# Patient Record
Sex: Male | Born: 1935 | Race: White | Hispanic: No | Smoking: Former smoker
Health system: Southern US, Community
[De-identification: ages and names within clinical notes are randomized; demographics above are authoritative.]

## PROBLEM LIST (undated history)

## (undated) DIAGNOSIS — K08109 Complete loss of teeth, unspecified cause, unspecified class: Secondary | ICD-10-CM

## (undated) DIAGNOSIS — I499 Cardiac arrhythmia, unspecified: Secondary | ICD-10-CM

## (undated) DIAGNOSIS — Z972 Presence of dental prosthetic device (complete) (partial): Secondary | ICD-10-CM

## (undated) DIAGNOSIS — G5790 Unspecified mononeuropathy of unspecified lower limb: Secondary | ICD-10-CM

## (undated) DIAGNOSIS — Z973 Presence of spectacles and contact lenses: Secondary | ICD-10-CM

## (undated) DIAGNOSIS — H9190 Unspecified hearing loss, unspecified ear: Secondary | ICD-10-CM

## (undated) DIAGNOSIS — K219 Gastro-esophageal reflux disease without esophagitis: Secondary | ICD-10-CM

## (undated) DIAGNOSIS — I10 Essential (primary) hypertension: Secondary | ICD-10-CM

## (undated) DIAGNOSIS — M1990 Unspecified osteoarthritis, unspecified site: Secondary | ICD-10-CM

## (undated) DIAGNOSIS — N400 Enlarged prostate without lower urinary tract symptoms: Secondary | ICD-10-CM

## (undated) HISTORY — PX: TONSILLECTOMY: SUR1361

## (undated) HISTORY — PX: COLONOSCOPY: SHX174

## (undated) HISTORY — PX: EYE SURGERY: SHX253

---

## 1980-02-07 HISTORY — PX: CERVICAL LAMINECTOMY: SHX94

## 1988-02-07 HISTORY — PX: PLANTAR FASCIA SURGERY: SHX746

## 1993-02-06 HISTORY — PX: HERNIA REPAIR: SHX51

## 1998-02-06 HISTORY — PX: ABDOMINAL HERNIA REPAIR: SHX539

## 2005-06-21 ENCOUNTER — Ambulatory Visit: Payer: Self-pay | Admitting: Unknown Physician Specialty

## 2005-08-23 ENCOUNTER — Ambulatory Visit: Payer: Self-pay | Admitting: Unknown Physician Specialty

## 2008-12-17 ENCOUNTER — Ambulatory Visit: Payer: Self-pay | Admitting: Unknown Physician Specialty

## 2009-01-15 ENCOUNTER — Ambulatory Visit: Payer: Self-pay | Admitting: Surgery

## 2009-01-22 ENCOUNTER — Ambulatory Visit: Payer: Self-pay | Admitting: Surgery

## 2009-02-06 HISTORY — PX: THUMB ARTHROSCOPY: SHX2509

## 2009-05-13 ENCOUNTER — Ambulatory Visit: Payer: Self-pay | Admitting: Internal Medicine

## 2012-09-02 ENCOUNTER — Ambulatory Visit: Payer: Self-pay | Admitting: Orthopedic Surgery

## 2012-12-13 ENCOUNTER — Other Ambulatory Visit: Payer: Self-pay | Admitting: Orthopedic Surgery

## 2012-12-27 ENCOUNTER — Encounter (HOSPITAL_BASED_OUTPATIENT_CLINIC_OR_DEPARTMENT_OTHER): Payer: Self-pay | Admitting: *Deleted

## 2012-12-27 NOTE — Progress Notes (Signed)
Will come in for bmet-[id number]-separated with wife but she is coming with him-to bring all meds dos Saw a cardiologist for irreg hr 2 yr ago-will get notes

## 2012-12-30 ENCOUNTER — Other Ambulatory Visit: Payer: Self-pay

## 2012-12-30 ENCOUNTER — Encounter (HOSPITAL_BASED_OUTPATIENT_CLINIC_OR_DEPARTMENT_OTHER)
Admission: RE | Admit: 2012-12-30 | Discharge: 2012-12-30 | Disposition: A | Discharge status: Home / Self Care | Payer: Medicare Other | Source: Ambulatory Visit | Attending: Orthopedic Surgery | Admitting: Orthopedic Surgery

## 2012-12-30 DIAGNOSIS — Z01818 Encounter for other preprocedural examination: Secondary | ICD-10-CM | POA: Insufficient documentation

## 2012-12-30 DIAGNOSIS — Z01812 Encounter for preprocedural laboratory examination: Secondary | ICD-10-CM | POA: Insufficient documentation

## 2012-12-30 DIAGNOSIS — Z01810 Encounter for preprocedural cardiovascular examination: Secondary | ICD-10-CM | POA: Insufficient documentation

## 2012-12-30 LAB — BASIC METABOLIC PANEL
Calcium: 9.6 mg/dL (ref 8.4–10.5)
GFR calc Af Amer: 83 mL/min — ABNORMAL LOW (ref >90)
GFR calc non Af Amer: 71 mL/min — ABNORMAL LOW (ref >90)
Potassium: 4.8 mEq/L (ref 3.5–5.1)
Sodium: 138 mEq/L (ref 135–145)

## 2013-01-01 NOTE — H&P (Signed)
Jimmy Hines is an 77 y.o. male.   CC / Reason for Visit: Right shoulder pain HPI: This patient is a 77 year old male who presents for evaluation of right shoulder pain, having undergone MRI scan at [facility] on 09-02-12.  While the images aren't available for my review, the most pertinent findings on the report are a.c. joint DJD and distal supraspinatus tendinosis versus incomplete tear.  The patient reports that his symptoms remain in fact awaken him at 320 this morning as she rolled onto his right side and had shoulder pain.   In the past, I have operated on both TMC joints with an Artelon interposition arthroplasty, left side on 01-14-08, right side prior to that.  On the left side is bothered by continued Z-shaped collapse.  However the right shoulder remains increasingly symptomatic.  Records indicate that I injected the subacromial space on 08-02-11, and he reports that he has had a couple of injections since then, the most recent 6-8 weeks ago by a rheumatologist.  He has had x-rays and then let to believe that he has spurs that jeopardize his rotator cuff.  Past Medical History  Diagnosis Date  . Wears glasses     reading  . Hypertension   . GERD (gastroesophageal reflux disease)   . Arthritis   . BPH (benign prostatic hypertrophy)   . Dysrhythmia     ireg hr  . Full dentures     Past Surgical History  Procedure Laterality Date  . Tonsillectomy    . Plantar fascia surgery  1990    right  . Thumb arthroscopy  2011    right and left  . Hernia repair  1995    rt ing   . Abdominal hernia repair  2000  . Cervical laminectomy  1982  . Colonoscopy      History reviewed. No pertinent family history. Social History:  reports that he quit smoking about 30 years ago. He does not have any smokeless tobacco history on file. He reports that he drinks alcohol. He reports that he does not use illicit drugs.  Allergies: No Known Allergies  No prescriptions prior  to admission    No results found for this or any previous visit (from the past 48 hour(s)). No results found.  Review of Systems  All other systems reviewed and are negative.    Height 5\' 11"  (1.803 m), weight 95.255 kg (210 lb). Physical Exam  Constitutional:  WD, WN, NAD HEENT:  NCAT, EOMI Neuro/Psych:  Alert & oriented to person, place, and time; appropriate mood & affect Lymphatic: No generalized UE edema or lymphadenopathy Extremities / MSK:  Both UE are normal with respect to appearance, ranges of motion, joint stability, muscle strength/tone, sensation, & perfusion except as otherwise noted:  Both hands exhibit small joint osteoarthritis with a Z-shaped collapse on the left side more so than the right side.  The [facility] joint is enlarged.  Right shoulder has full range of motion equal to the contralateral side.  He has some subacromial tenderness and positive impingement maneuvers.  He has increased pain with empty can testing, but no a.c. joint tenderness or pain with cross arm test.  Labs / Xrays:  No radiographic studies obtained today.  Assessment:  Right shoulder pain, with subacromial bursitis and possible rotator cuff pathology  Plan: We discussed the MRI findings, at least as represented in the report.  He would like to proceed with arthroscopic evaluation and debridement versus repair, possibly with a  DCE as well.    Atara Paterson A. 01/01/2013, 3:24 PM

## 2013-01-06 ENCOUNTER — Encounter (HOSPITAL_BASED_OUTPATIENT_CLINIC_OR_DEPARTMENT_OTHER): Payer: Self-pay | Admitting: Certified Registered Nurse Anesthetist

## 2013-01-06 ENCOUNTER — Encounter (HOSPITAL_BASED_OUTPATIENT_CLINIC_OR_DEPARTMENT_OTHER): Payer: Medicare Other | Admitting: Certified Registered Nurse Anesthetist

## 2013-01-06 ENCOUNTER — Ambulatory Visit (HOSPITAL_BASED_OUTPATIENT_CLINIC_OR_DEPARTMENT_OTHER): Payer: Medicare Other | Admitting: Certified Registered Nurse Anesthetist

## 2013-01-06 ENCOUNTER — Ambulatory Visit (HOSPITAL_BASED_OUTPATIENT_CLINIC_OR_DEPARTMENT_OTHER)
Admission: RE | Admit: 2013-01-06 | Discharge: 2013-01-06 | Disposition: A | Discharge status: Home / Self Care | Payer: Medicare Other | Source: Ambulatory Visit | Attending: Orthopedic Surgery | Admitting: Orthopedic Surgery

## 2013-01-06 ENCOUNTER — Encounter (HOSPITAL_BASED_OUTPATIENT_CLINIC_OR_DEPARTMENT_OTHER)
Admission: RE | Disposition: A | Discharge status: Home / Self Care | Payer: Self-pay | Source: Ambulatory Visit | Attending: Orthopedic Surgery

## 2013-01-06 DIAGNOSIS — M75110 Incomplete rotator cuff tear or rupture of unspecified shoulder, not specified as traumatic: Secondary | ICD-10-CM | POA: Insufficient documentation

## 2013-01-06 DIAGNOSIS — M9420 Chondromalacia, unspecified site: Secondary | ICD-10-CM | POA: Insufficient documentation

## 2013-01-06 DIAGNOSIS — I10 Essential (primary) hypertension: Secondary | ICD-10-CM | POA: Insufficient documentation

## 2013-01-06 DIAGNOSIS — Z87891 Personal history of nicotine dependence: Secondary | ICD-10-CM | POA: Insufficient documentation

## 2013-01-06 DIAGNOSIS — M6789 Other specified disorders of synovium and tendon, multiple sites: Secondary | ICD-10-CM | POA: Insufficient documentation

## 2013-01-06 DIAGNOSIS — M719 Bursopathy, unspecified: Secondary | ICD-10-CM | POA: Insufficient documentation

## 2013-01-06 DIAGNOSIS — M67919 Unspecified disorder of synovium and tendon, unspecified shoulder: Secondary | ICD-10-CM | POA: Insufficient documentation

## 2013-01-06 HISTORY — DX: Presence of spectacles and contact lenses: Z97.3

## 2013-01-06 HISTORY — DX: Essential (primary) hypertension: I10

## 2013-01-06 HISTORY — DX: Cardiac arrhythmia, unspecified: I49.9

## 2013-01-06 HISTORY — DX: Benign prostatic hyperplasia without lower urinary tract symptoms: N40.0

## 2013-01-06 HISTORY — DX: Gastro-esophageal reflux disease without esophagitis: K21.9

## 2013-01-06 HISTORY — DX: Presence of dental prosthetic device (complete) (partial): Z97.2

## 2013-01-06 HISTORY — DX: Unspecified osteoarthritis, unspecified site: M19.90

## 2013-01-06 HISTORY — PX: SHOULDER ARTHROSCOPY WITH ROTATOR CUFF REPAIR: SHX5685

## 2013-01-06 HISTORY — DX: Complete loss of teeth, unspecified cause, unspecified class: K08.109

## 2013-01-06 LAB — POCT HEMOGLOBIN-HEMACUE: Hemoglobin: 15.9 g/dL (ref 13.0–17.0)

## 2013-01-06 SURGERY — ARTHROSCOPY, SHOULDER, WITH ROTATOR CUFF REPAIR
Anesthesia: General | Site: Shoulder | Laterality: Right | Wound class: Clean

## 2013-01-06 MED ORDER — MIDAZOLAM HCL 2 MG/2ML IJ SOLN
1.0000 mg | INTRAMUSCULAR | Status: DC | PRN
  Administered 2013-01-06: 2 mg via INTRAVENOUS

## 2013-01-06 MED ORDER — OXYCODONE HCL 5 MG/5ML PO SOLN: 5.0000 mg | Freq: Once | ORAL | Status: DC | PRN

## 2013-01-06 MED ORDER — HYDROMORPHONE HCL PF 1 MG/ML IJ SOLN
0.2500 mg | INTRAMUSCULAR | Status: DC | PRN
  Administered 2013-01-06: 0.25 mg via INTRAVENOUS

## 2013-01-06 MED ORDER — FENTANYL CITRATE 0.05 MG/ML IJ SOLN
INTRAMUSCULAR | Status: AC
  Filled 2013-01-06: qty 6

## 2013-01-06 MED ORDER — LACTATED RINGERS IV SOLN
INTRAVENOUS | Status: DC
  Administered 2013-01-06 (×2): via INTRAVENOUS

## 2013-01-06 MED ORDER — OXYCODONE HCL 5 MG PO TABS: 5.0000 mg | ORAL_TABLET | Freq: Once | ORAL | Status: DC | PRN

## 2013-01-06 MED ORDER — BUPIVACAINE-EPINEPHRINE 0.25% -1:200000 IJ SOLN
INTRAMUSCULAR | Status: DC | PRN
  Administered 2013-01-06: 20 mL

## 2013-01-06 MED ORDER — PROPOFOL 10 MG/ML IV BOLUS
INTRAVENOUS | Status: DC | PRN
  Administered 2013-01-06: 200 mg via INTRAVENOUS

## 2013-01-06 MED ORDER — EPHEDRINE SULFATE 50 MG/ML IJ SOLN
INTRAMUSCULAR | Status: DC | PRN
  Administered 2013-01-06: 10 mg via INTRAVENOUS

## 2013-01-06 MED ORDER — DEXAMETHASONE SODIUM PHOSPHATE 4 MG/ML IJ SOLN
INTRAMUSCULAR | Status: DC | PRN
  Administered 2013-01-06: 4 mg via INTRAVENOUS

## 2013-01-06 MED ORDER — EPINEPHRINE HCL 1 MG/ML IJ SOLN
INTRAMUSCULAR | Status: DC | PRN
  Administered 2013-01-06: 1 mg

## 2013-01-06 MED ORDER — CHLORHEXIDINE GLUCONATE 4 % EX LIQD: 60.0000 mL | Freq: Once | CUTANEOUS | Status: DC

## 2013-01-06 MED ORDER — BUPIVACAINE-EPINEPHRINE PF 0.5-1:200000 % IJ SOLN
INTRAMUSCULAR | Status: AC
  Filled 2013-01-06: qty 30

## 2013-01-06 MED ORDER — FENTANYL CITRATE 0.05 MG/ML IJ SOLN
50.0000 ug | INTRAMUSCULAR | Status: DC | PRN
  Administered 2013-01-06: 100 ug via INTRAVENOUS

## 2013-01-06 MED ORDER — HYDROMORPHONE HCL PF 1 MG/ML IJ SOLN
INTRAMUSCULAR | Status: AC
  Filled 2013-01-06: qty 1

## 2013-01-06 MED ORDER — MIDAZOLAM HCL 2 MG/2ML IJ SOLN
INTRAMUSCULAR | Status: AC
  Filled 2013-01-06: qty 2

## 2013-01-06 MED ORDER — ONDANSETRON HCL 4 MG/2ML IJ SOLN: 4.0000 mg | Freq: Once | INTRAMUSCULAR | Status: DC | PRN

## 2013-01-06 MED ORDER — LIDOCAINE HCL (CARDIAC) 20 MG/ML IV SOLN
INTRAVENOUS | Status: DC | PRN
  Administered 2013-01-06: 30 mg via INTRAVENOUS

## 2013-01-06 MED ORDER — PROPOFOL 10 MG/ML IV EMUL
INTRAVENOUS | Status: AC
  Filled 2013-01-06: qty 50

## 2013-01-06 MED ORDER — OXYCODONE-ACETAMINOPHEN 5-325 MG PO TABS: 1.0000 | ORAL_TABLET | ORAL | Status: DC | PRN

## 2013-01-06 MED ORDER — LACTATED RINGERS IR SOLN
Status: DC | PRN
  Administered 2013-01-06: 15000 mL

## 2013-01-06 MED ORDER — LACTATED RINGERS IV SOLN: INTRAVENOUS | Status: DC

## 2013-01-06 MED ORDER — FENTANYL CITRATE 0.05 MG/ML IJ SOLN
INTRAMUSCULAR | Status: AC
  Filled 2013-01-06: qty 2

## 2013-01-06 MED ORDER — ONDANSETRON HCL 4 MG/2ML IJ SOLN
INTRAMUSCULAR | Status: DC | PRN
  Administered 2013-01-06: 4 mg via INTRAVENOUS

## 2013-01-06 MED ORDER — PHENYLEPHRINE HCL 10 MG/ML IJ SOLN
10.0000 mg | INTRAVENOUS | Status: DC | PRN
  Administered 2013-01-06: 40 ug/min via INTRAVENOUS

## 2013-01-06 MED ORDER — HYDROMORPHONE HCL PF 1 MG/ML IJ SOLN: 0.5000 mg | INTRAMUSCULAR | Status: DC | PRN

## 2013-01-06 MED ORDER — CEFAZOLIN SODIUM-DEXTROSE 2-3 GM-% IV SOLR
2.0000 g | INTRAVENOUS | Status: AC
  Administered 2013-01-06: 2 g via INTRAVENOUS

## 2013-01-06 MED ORDER — SUCCINYLCHOLINE CHLORIDE 20 MG/ML IJ SOLN
INTRAMUSCULAR | Status: DC | PRN
  Administered 2013-01-06: 50 mg via INTRAVENOUS

## 2013-01-06 MED ORDER — GLYCOPYRROLATE 0.2 MG/ML IJ SOLN
INTRAMUSCULAR | Status: DC | PRN
  Administered 2013-01-06: 0.2 mg via INTRAVENOUS

## 2013-01-06 MED ORDER — BUPIVACAINE-EPINEPHRINE PF 0.25-1:200000 % IJ SOLN
INTRAMUSCULAR | Status: AC
  Filled 2013-01-06: qty 30

## 2013-01-06 MED ORDER — EPINEPHRINE HCL 1 MG/ML IJ SOLN
INTRAMUSCULAR | Status: AC
  Filled 2013-01-06: qty 1

## 2013-01-06 MED ORDER — FENTANYL CITRATE 0.05 MG/ML IJ SOLN
INTRAMUSCULAR | Status: DC | PRN
  Administered 2013-01-06: 50 ug via INTRAVENOUS

## 2013-01-06 SURGICAL SUPPLY — 85 items
ANCHOR BIO SWLOCK 4.75 W/TIG (Anchor) ×2 IMPLANT
ANCHOR SUT BIO SW 4.75X19.1 (Anchor) ×2 IMPLANT
BENZOIN TINCTURE PRP APPL 2/3 (GAUZE/BANDAGES/DRESSINGS) IMPLANT
BLADE AVERAGE 25X9 (BLADE) IMPLANT
BLADE CUDA 5.5 (BLADE) IMPLANT
BLADE CUDA SHAVER 3.5 (BLADE) IMPLANT
BLADE CUTTER GATOR 3.5 (BLADE) ×2 IMPLANT
BLADE GREAT WHITE 4.2 (BLADE) IMPLANT
BLADE SURG 15 STRL LF DISP TIS (BLADE) IMPLANT
BLADE SURG 15 STRL SS (BLADE)
BUR OVAL 4.0 (BURR) IMPLANT
BUR OVAL 6.0 (BURR) ×2 IMPLANT
BUR VERTEX HOODED 4.5 (BURR) IMPLANT
CANISTER SUCT 3000ML (MISCELLANEOUS) IMPLANT
CANISTER SUCT LVC 12 LTR MEDI- (MISCELLANEOUS) ×2 IMPLANT
CANNULA 5.75X71 LONG (CANNULA) IMPLANT
CANNULA PASSPORT 3 (MISCELLANEOUS) ×2 IMPLANT
CANNULA SHOULDER 7CM (CANNULA) IMPLANT
CANNULA TWIST IN 8.25X7CM (CANNULA) ×2 IMPLANT
CHLORAPREP W/TINT 26ML (MISCELLANEOUS) ×4 IMPLANT
CLEANER CAUTERY TIP 5X5 PAD (MISCELLANEOUS) IMPLANT
COVER MAYO STAND STRL (DRAPES) ×2 IMPLANT
DECANTER SPIKE VIAL GLASS SM (MISCELLANEOUS) IMPLANT
DERMABOND ADVANCED (GAUZE/BANDAGES/DRESSINGS)
DERMABOND ADVANCED .7 DNX12 (GAUZE/BANDAGES/DRESSINGS) IMPLANT
DRAPE ARTHROSCOPY W/POUCH 90 (DRAPES) IMPLANT
DRAPE STERI 35X30 U-POUCH (DRAPES) ×2 IMPLANT
DRAPE SURG 17X23 STRL (DRAPES) ×4 IMPLANT
DRAPE U-SHAPE 47X51 STRL (DRAPES) ×2 IMPLANT
DRAPE U-SHAPE 76X120 STRL (DRAPES) ×4 IMPLANT
DRSG EMULSION OIL 3X3 NADH (GAUZE/BANDAGES/DRESSINGS) ×2 IMPLANT
DRSG TEGADERM 4X4.75 (GAUZE/BANDAGES/DRESSINGS) IMPLANT
ELECT MENISCUS 165MM 90D (ELECTRODE) IMPLANT
ELECT REM PT RETURN 9FT ADLT (ELECTROSURGICAL) ×2
ELECTRODE REM PT RTRN 9FT ADLT (ELECTROSURGICAL) ×1 IMPLANT
GLOVE BIO SURGEON STRL SZ7.5 (GLOVE) ×2 IMPLANT
GLOVE BIOGEL PI IND STRL 7.0 (GLOVE) ×2 IMPLANT
GLOVE BIOGEL PI IND STRL 8 (GLOVE) ×1 IMPLANT
GLOVE BIOGEL PI INDICATOR 7.0 (GLOVE) ×2
GLOVE BIOGEL PI INDICATOR 8 (GLOVE) ×1
GLOVE ECLIPSE 6.5 STRL STRAW (GLOVE) ×2 IMPLANT
GOWN PREVENTION PLUS XLARGE (GOWN DISPOSABLE) ×4 IMPLANT
IV LACTATED RINGER IRRG 3000ML (IV SOLUTION) ×4
IV LR IRRIG 3000ML ARTHROMATIC (IV SOLUTION) ×4 IMPLANT
NDL SUT 6 .5 CRC .975X.05 MAYO (NEEDLE) IMPLANT
NEEDLE MAYO TAPER (NEEDLE)
NEEDLE SCORPION MULTI FIRE (NEEDLE) ×2 IMPLANT
PACK ARTHROSCOPY DSU (CUSTOM PROCEDURE TRAY) ×2 IMPLANT
PACK BASIN DAY SURGERY FS (CUSTOM PROCEDURE TRAY) ×2 IMPLANT
PAD CLEANER CAUTERY TIP 5X5 (MISCELLANEOUS)
PASSER SUT SWANSON 36MM LOOP (INSTRUMENTS) IMPLANT
PENCIL BUTTON HOLSTER BLD 10FT (ELECTRODE) IMPLANT
SET ARTHROSCOPY TUBING (MISCELLANEOUS) ×1
SET ARTHROSCOPY TUBING LN (MISCELLANEOUS) ×1 IMPLANT
SHEET MEDIUM DRAPE 40X70 STRL (DRAPES) ×2 IMPLANT
SLING ARM FOAM STRAP LRG (SOFTGOODS) ×2 IMPLANT
SLING ARM FOAM STRAP MED (SOFTGOODS) IMPLANT
SLING ARM FOAM STRAP SML (SOFTGOODS) IMPLANT
SLING ARM FOAM STRAP XLG (SOFTGOODS) IMPLANT
SLING ULTRA II LARGE (SOFTGOODS) IMPLANT
SLING ULTRA III MED (ORTHOPEDIC SUPPLIES) IMPLANT
SPONGE GAUZE 4X4 12PLY (GAUZE/BANDAGES/DRESSINGS) ×2 IMPLANT
SPONGE LAP 4X18 X RAY DECT (DISPOSABLE) IMPLANT
STAPLER VISISTAT 35W (STAPLE) ×2 IMPLANT
STRIP CLOSURE SKIN 1/2X4 (GAUZE/BANDAGES/DRESSINGS) IMPLANT
SUCTION FRAZIER TIP 10 FR DISP (SUCTIONS) IMPLANT
SUT ETHIBOND 2 OS 4 DA (SUTURE) IMPLANT
SUT ETHILON 3 0 PS 1 (SUTURE) IMPLANT
SUT FIBERWIRE #2 38 T-5 BLUE (SUTURE)
SUT TIGER TAPE 7 IN WHITE (SUTURE) IMPLANT
SUT VIC AB 0 SH 27 (SUTURE) IMPLANT
SUT VIC AB 2-0 CT3 27 (SUTURE) IMPLANT
SUT VIC AB 2-0 SH 27 (SUTURE)
SUT VIC AB 2-0 SH 27XBRD (SUTURE) IMPLANT
SUT VIC AB 3-0 SH 27 (SUTURE) ×1
SUT VIC AB 3-0 SH 27X BRD (SUTURE) ×1 IMPLANT
SUT VICRYL 4-0 PS2 18IN ABS (SUTURE) IMPLANT
SUT VICRYL RAPIDE 4/0 PS 2 (SUTURE) IMPLANT
SUTURE FIBERWR #2 38 T-5 BLUE (SUTURE) IMPLANT
SYR BULB 3OZ (MISCELLANEOUS) IMPLANT
TAPE FIBER 2MM 7IN #2 BLUE (SUTURE) IMPLANT
TOWEL OR 17X24 6PK STRL BLUE (TOWEL DISPOSABLE) ×2 IMPLANT
TOWEL OR NON WOVEN STRL DISP B (DISPOSABLE) IMPLANT
WAND STAR VAC 90 (SURGICAL WAND) ×2 IMPLANT
YANKAUER SUCT BULB TIP NO VENT (SUCTIONS) ×2 IMPLANT

## 2013-01-06 NOTE — Anesthesia Postprocedure Evaluation (Signed)
  Anesthesia Post-op Note  Patient: Jimmy Hines  Procedure(s) Performed: Procedure(s): RIGHT SHOULDER ARTHROSCOPY WITH ROTATOR CUFF REPAIR POSSIBLE DISTAL CLAVICAL EXCISION (Right)  Patient Location: PACU  Anesthesia Type:GA combined with regional for post-op pain  Level of Consciousness: awake, alert  and oriented  Airway and Oxygen Therapy: Patient Spontanous Breathing and Patient connected to face mask oxygen  Post-op Pain: mild  Post-op Assessment: Post-op Vital signs reviewed  Post-op Vital Signs: Reviewed  Complications: No apparent anesthesia complications

## 2013-01-06 NOTE — Interval H&P Note (Signed)
History and Physical Interval Note:  01/06/2013 7:34 AM  Jimmy Hines  has presented today for surgery, with the diagnosis of right shoulder partial rotator cuff tear  The various methods of treatment have been discussed with the patient and family. After consideration of risks, benefits and other options for treatment, the patient has consented to  Procedure(s): RIGHT SHOULDER ARTHROSCOPY WITH POSSIBLEROTATOR CUFF REPAIR POSSIBLE DISTAL CLAVICAL EXCISION (Right) as a surgical intervention .  The patient's history has been reviewed, patient examined, no change in status, stable for surgery.  I have reviewed the patient's chart and labs.  Questions were answered to the patient's satisfaction.     Tamar Lipscomb A.

## 2013-01-06 NOTE — Anesthesia Preprocedure Evaluation (Addendum)
Anesthesia Evaluation  Patient identified by MRN, date of birth, ID band Patient awake    Reviewed: Allergy & Precautions, NPO status   Airway Mallampati: I TM Distance: >3 FB Neck ROM: Full    Dental  (+) Teeth Intact and Dental Advisory Given   Pulmonary former smoker,  breath sounds clear to auscultation        Cardiovascular hypertension, Pt. on medications and Pt. on home beta blockers + dysrhythmias Rhythm:Regular Rate:Normal     Neuro/Psych    GI/Hepatic GERD-  Medicated and Controlled,  Endo/Other    Renal/GU      Musculoskeletal   Abdominal   Peds  Hematology   Anesthesia Other Findings   Reproductive/Obstetrics                          Anesthesia Physical Anesthesia Plan  ASA: II  Anesthesia Plan: General   Post-op Pain Management:    Induction: Intravenous  Airway Management Planned: Oral ETT  Additional Equipment:   Intra-op Plan:   Post-operative Plan: Extubation in OR  Informed Consent: I have reviewed the patients History and Physical, chart, labs and discussed the procedure including the risks, benefits and alternatives for the proposed anesthesia with the patient or authorized representative who has indicated his/her understanding and acceptance.   Dental advisory given  Plan Discussed with: CRNA, Anesthesiologist and Surgeon  Anesthesia Plan Comments:         Anesthesia Quick Evaluation

## 2013-01-06 NOTE — Progress Notes (Signed)
Assisted Dr. Crews with right, ultrasound guided, interscalene  block. Side rails up, monitors on throughout procedure. See vital signs in flow sheet. Tolerated Procedure well. 

## 2013-01-06 NOTE — Op Note (Signed)
01/06/2013  7:35 AM  PATIENT:  Jimmy Hines  77 y.o. male  PRE-OPERATIVE DIAGNOSIS:  Right shoulder rotator cuff tear, subacromial bursitis, a.c. joint arthritis  POST-OPERATIVE DIAGNOSIS:  Same, plus bicipital tendinosis  PROCEDURE:  Right shoulder arthroscopy with intra-articular debridement, biceps tenotomy, rotator cuff repair, subacromial decompression with acromioplasty and distal clavicle excision  SURGEON: Cliffton Asters. Janee Morn, MD  PHYSICIAN ASSISTANT: None  ANESTHESIA:  regional and general  SPECIMENS:  None  DRAINS:   None  PREOPERATIVE INDICATIONS:  Jimmy Hines is a  77 y.o. male with a diagnosis of right shoulder pain with MRI suggestive of rotator cuff tear who failed conservative measures and elected for surgical management.    The risks benefits and alternatives were discussed with the patient preoperatively including but not limited to the risks of infection, bleeding, nerve injury, cardiopulmonary complications, the need for revision surgery, among others, and the patient verbalized understanding and consented to proceed.  OPERATIVE IMPLANTS: 4.75 mm Arthrex swivel lock anchors x2 with double row repair  OPERATIVE PROCEDURE:  After receiving prophylactic antibiotics and interscalene block, the patient was escorted to the operative theatre and placed in a supine position.  General anesthesia was administered.  He was positioned in a beachchair position with care to ensure the neck was not hyperextended and the appropriate pressure points padded. The right upper extremity was then prepped with DuraPrep and draped in usual sterile fashion. A surgical "time-out" was performed during which the planned procedure, proposed operative site, and the correct patient identity were compared to the operative consent and agreement confirmed by the circulating nurse according to current facility policy.     Extra landmarks were drawn and the possible portal sites anesthetized with  quarter percent Marcaine with epinephrine. Posterior viewing portal was established first and intra-articular arthroscopy commenced. Anterior oral was established after needle localization in the area between the subscap and biceps tendon. A probe was inserted as was the suction shaver through the anterior portal. There was about 50% detachment of the biceps at its labral attachment and split tearing up and down the length of the intra-articular portion of the tendon. In addition there was some fraying of the upper border of the subscap.  A basket punch was used to divide the tendon of the biceps is far away from the labrum as could be reasonably done. The intra-articular portion was then excised with the suction shaver and the labrum debrided much around the entire surface. The chondral surfaces of the glenoid had grade 1-2 chondromalacia which also existed on the humerus. The rotator cuff was intact except for a small U-shaped tear of the supraspinatus. The scope was then placed into the subacromial space and a anterolateral portal established. The ArthriCare wand was used to expose the anterolateral aspect of the acromion as well as helping to clear some of the subacromial bursa. Suction shaver was used as well. The tear was identified and a second more posterior portal was established, a middle lateral portal. Arthrex passport was established. The tear was identified, the edges debrided, and shaver used to lightly touched the footprint preparing it. A 4.75 mm Arthrex swivel lock anchor was placed on the articular margin and S2 tails brought through 2 portions of the U-shaped tear. A single lateral swivel lock anchor was placed securing the 2 tails, before securing it was noted that this may create a central dogear and so a single FiberWire suture was placed to the middle of that and brought through the anchor itself  thus helping to better secured without dogear. The tails were all clipped. The cuff repair was  good. Attention was directed to performing the subacromial decompression and acromioplasty with the bur and then attention directed to the distal clavicle excision. Visualization of the distal clavicle was intermittently OK secondary to bleeding and ultimately a direct superior portal was established to help ensure that the posterior portion of the clavicle was adequately debrided. Instruments were removed. The portals were all closed with 3-0 Vicryl interrupted suture followed by staples at the skin and dressing applied. He was placed into a sling, awakened, and taken to recovery in stable condition breathing spontaneously.  DISPOSITION: The patient will be discharged home today with typical instructions returning in 10-15 days for reevaluation and to initiate formal therapy for rotator cuff rehabilitation at that time.

## 2013-01-06 NOTE — Anesthesia Procedure Notes (Addendum)
Anesthesia Regional Block:  Interscalene brachial plexus block  Pre-Anesthetic Checklist: ,, timeout performed, Correct Patient, Correct Site, Correct Laterality, Correct Procedure, Correct Position, site marked, Risks and benefits discussed,  Surgical consent,  Pre-op evaluation,  At surgeon's request and post-op pain management  Laterality: Right and Upper  Prep: chloraprep       Needles:  Injection technique: Single-shot  Needle Type: Echogenic Needle     Needle Length: 5cm 5 cm Needle Gauge: 21 and 21 G    Additional Needles:  Procedures: ultrasound guided (picture in chart) Interscalene brachial plexus block Narrative:  Start time: 01/06/2013 6:55 AM End time: 01/06/2013 7:20 AM Injection made incrementally with aspirations every 5 mL.  Performed by: Personally  Anesthesiologist: Sheldon Silvan, MD   Procedure Name: Intubation Date/Time: 01/06/2013 7:45 AM Performed by: Janee Morn, DAVID A Pre-anesthesia Checklist: Patient identified, Emergency Drugs available, Suction available and Patient being monitored Patient Re-evaluated:Patient Re-evaluated prior to inductionOxygen Delivery Method: Circle System Utilized Preoxygenation: Pre-oxygenation with 100% oxygen Intubation Type: IV induction Ventilation: Mask ventilation without difficulty Laryngoscope Size: Mac and 3 Grade View: Grade I Tube type: Oral Tube size: 7.0 mm Number of attempts: 1 Airway Equipment and Method: stylet,  oral airway and LTA kit utilized Placement Confirmation: ETT inserted through vocal cords under direct vision,  positive ETCO2 and breath sounds checked- equal and bilateral Secured at: 21 cm Tube secured with: Tape Dental Injury: Teeth and Oropharynx as per pre-operative assessment

## 2013-01-06 NOTE — Transfer of Care (Signed)
Immediate Anesthesia Transfer of Care Note  Patient: Jimmy Hines  Procedure(s) Performed: Procedure(s): RIGHT SHOULDER ARTHROSCOPY WITH ROTATOR CUFF REPAIR POSSIBLE DISTAL CLAVICAL EXCISION (Right)  Patient Location: PACU  Anesthesia Type:GA combined with regional for post-op pain  Level of Consciousness: awake and patient cooperative  Airway & Oxygen Therapy: Patient Spontanous Breathing and Patient connected to face mask oxygen  Post-op Assessment: Report given to PACU RN and Post -op Vital signs reviewed and stable  Post vital signs: Reviewed and stable  Complications: No apparent anesthesia complications

## 2013-01-07 ENCOUNTER — Encounter (HOSPITAL_BASED_OUTPATIENT_CLINIC_OR_DEPARTMENT_OTHER): Payer: Self-pay | Admitting: Orthopedic Surgery

## 2014-01-22 ENCOUNTER — Ambulatory Visit: Payer: Self-pay | Admitting: Unknown Physician Specialty

## 2014-05-08 ENCOUNTER — Ambulatory Visit
Admit: 2014-05-08 | Disposition: A | Discharge status: Home / Self Care | Payer: Self-pay | Attending: Physical Medicine and Rehabilitation | Admitting: Physical Medicine and Rehabilitation

## 2014-05-15 ENCOUNTER — Ambulatory Visit
Admit: 2014-05-15 | Disposition: A | Discharge status: Home / Self Care | Payer: Self-pay | Attending: Physical Medicine and Rehabilitation | Admitting: Physical Medicine and Rehabilitation

## 2015-11-24 ENCOUNTER — Encounter: Payer: Self-pay | Admitting: Podiatry

## 2015-11-24 ENCOUNTER — Other Ambulatory Visit: Payer: Self-pay | Admitting: *Deleted

## 2015-11-24 ENCOUNTER — Ambulatory Visit (INDEPENDENT_AMBULATORY_CARE_PROVIDER_SITE_OTHER): Payer: Medicare Other | Admitting: Podiatry

## 2015-11-24 DIAGNOSIS — G629 Polyneuropathy, unspecified: Secondary | ICD-10-CM

## 2015-11-24 NOTE — Progress Notes (Signed)
   Subjective:    Patient ID: Jimmy Hines, male    DOB: Sep 23, 1935, 80 y.o.   MRN: [id number]  HPI: He presents today with plantar forefoot and bilateral toe discomfort he states that it feels cold and numb when he touches it is actually not cold. He said the sensation is been present for about several months and feels he would like a band or strap that he seems to be pulling from the arch plantarly. He states the majority of it is in the ball of the foot he has also noticed some left hip pain recently. He denies history of diabetes back trauma alcohol is. He states that he continues to take his B vitamins regularly.    Review of Systems  Musculoskeletal: Positive for arthralgias.  All other systems reviewed and are negative.      Objective:   Physical Exam: Vital signs are stable he is alert and oriented 3. Pulses are palpable. Neurologic sensorium is actually intact bilateral. Deep tendon reflexes are intact bilateral muscle strength is normal bilateral. Mild Leksell hammertoe deformities are noted bilateral. Cutaneous evaluation does not demonstrate any type of open lesions or wounds.        Assessment & Plan:  Idiopathic neuropathy.  Plan: Less than a lot of time talking with him about neuropathy today and that with his being so early and so light that he would probably not be picked up on her nerve conduction test. I recommended that he start taking B complex vitamin and make sure that he sees his primary doctor to rule out any other causes. I will follow-up with him as this progresses at which time an nerve conduction test or an epidermal nerve fiber density test will be necessary.

## 2015-12-01 ENCOUNTER — Other Ambulatory Visit: Payer: Self-pay | Admitting: *Deleted

## 2015-12-01 MED ORDER — NONFORMULARY OR COMPOUNDED ITEM: 3 refills | Status: AC

## 2016-01-26 IMAGING — MR MRA HEAD WITHOUT CONTRAST
1 series · 25 of 48 positions shown · non-contrast
Comparison: 05/08/2014 cervical spine MR.

CLINICAL DATA: 78-year-old male with cervical spine MR reporting
enlarged left vertebral artery. Subsequent encounter.

EXAM:
MRA HEAD WITHOUT CONTRAST
TECHNIQUE: Angiographic images of the Circle of Willis were obtained using MRA
technique without intravenous contrast.

[Series 2: TOF · axial · 0.8mm · 0.39mm/px · z∈[-66,+46]mm · 25 of 148 slices shown]
[im 1/148]
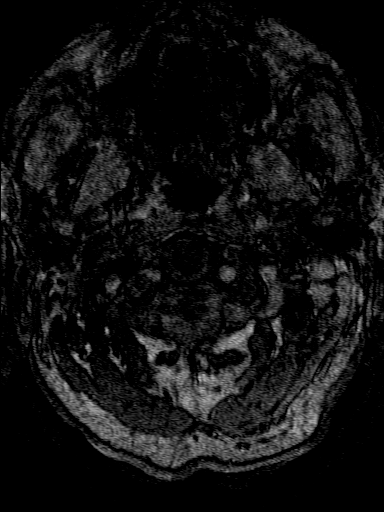
[im 4/148]
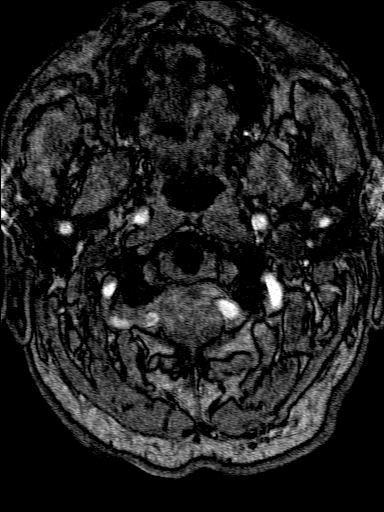
[im 7/148]
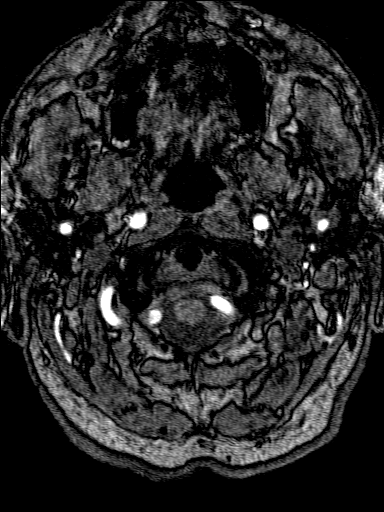
[im 10/148]
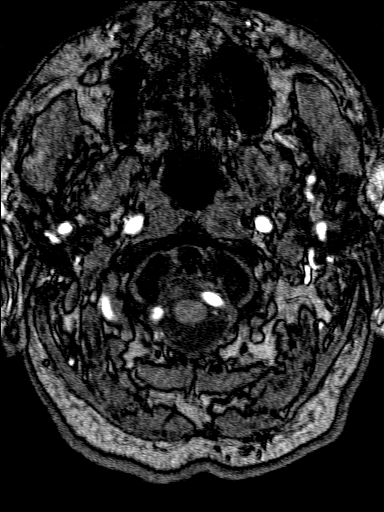
[im 13/148]
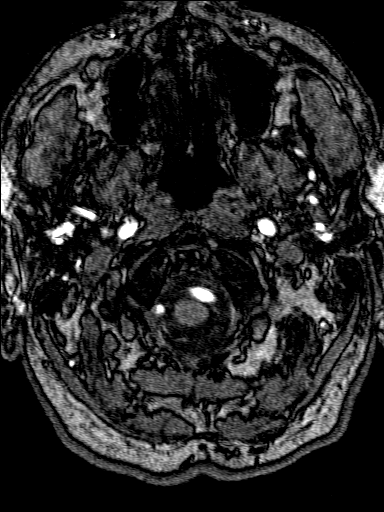
[im 16/148]
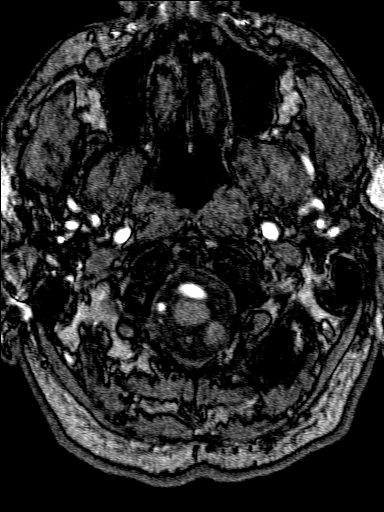
[im 19/148]
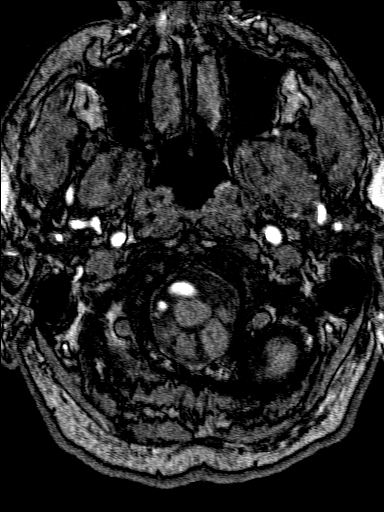
[im 22/148]
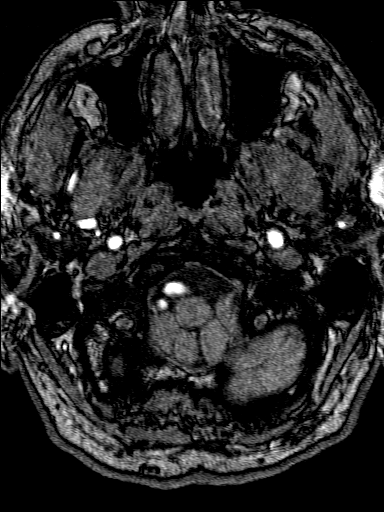
[im 26/148]
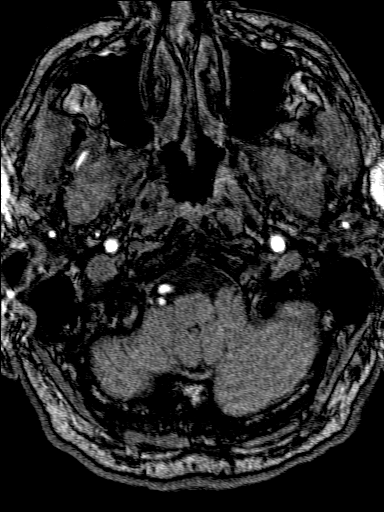
[im 29/148]
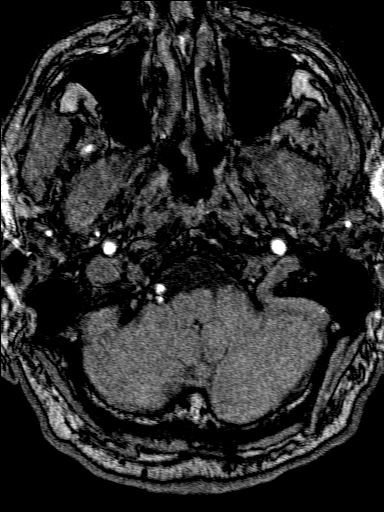
[im 32/148]
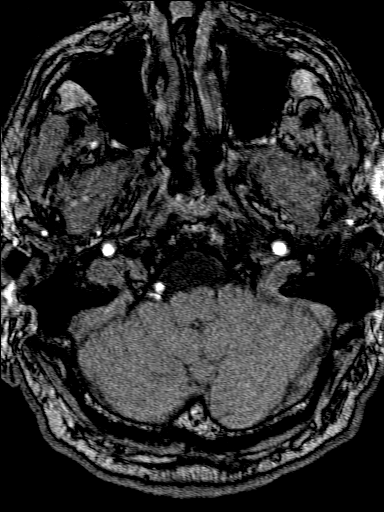
[im 35/148]
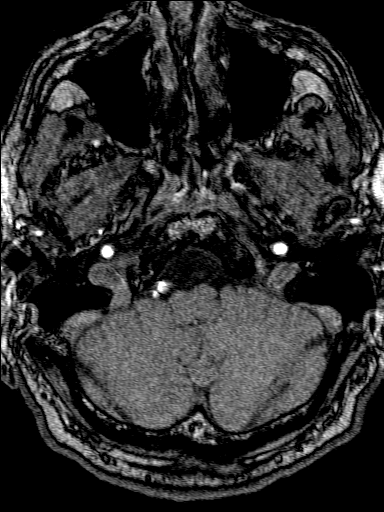
[im 38/148]
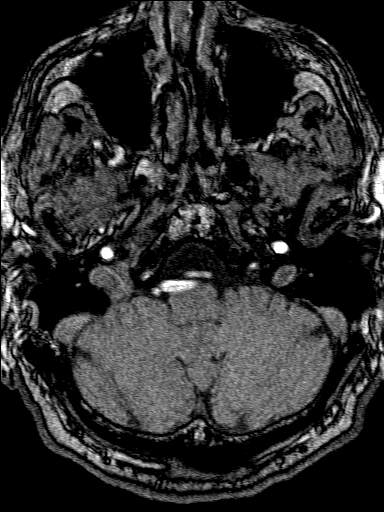
[im 41/148]
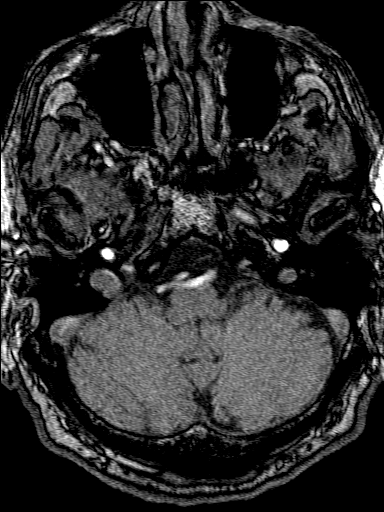
[im 44/148]
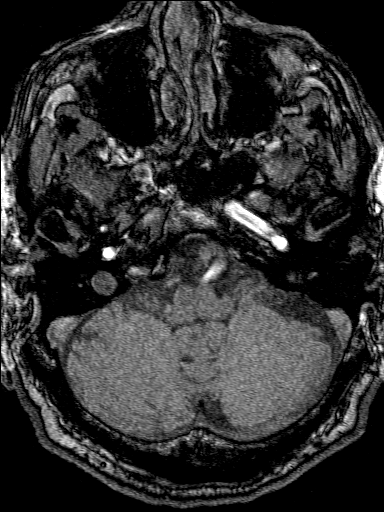
[im 47/148]
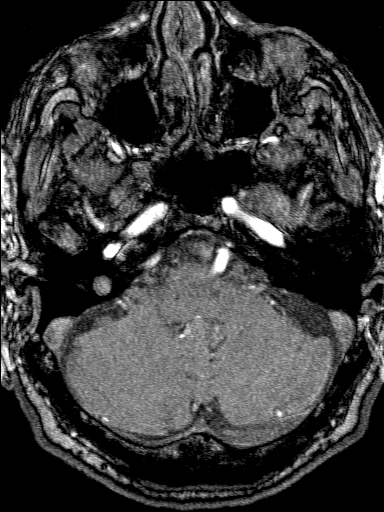
[im 51/148]
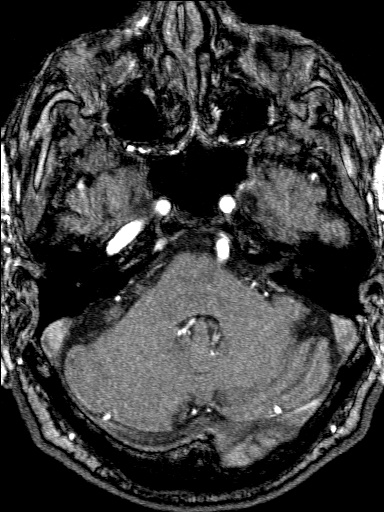
[im 54/148]
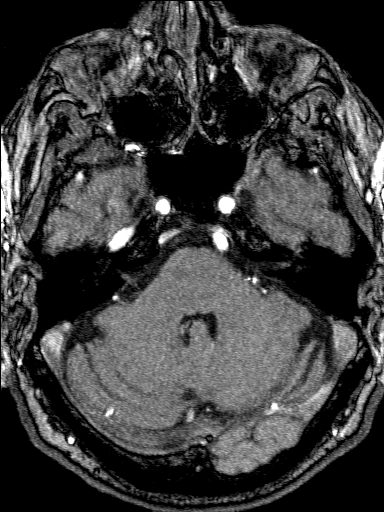
[im 66/148]
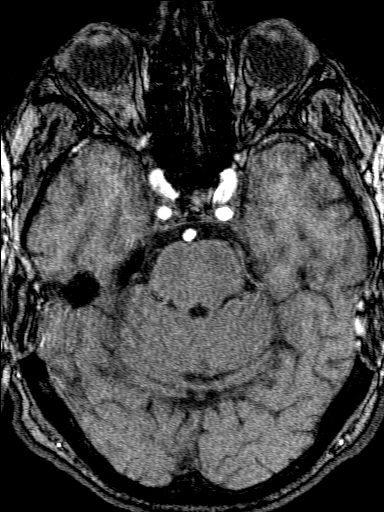
[im 76/148]
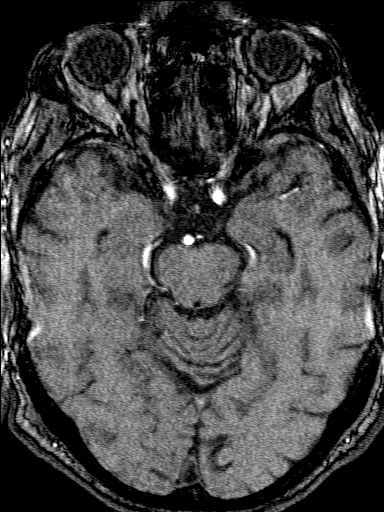
[im 85/148]
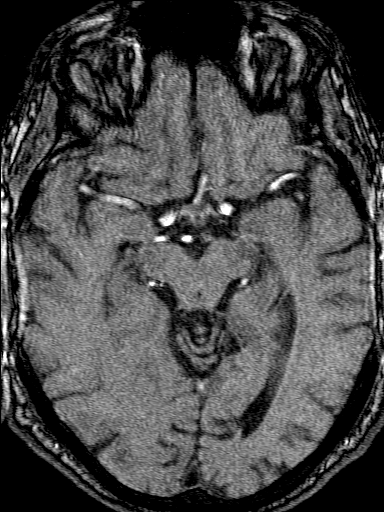
[im 104/148]
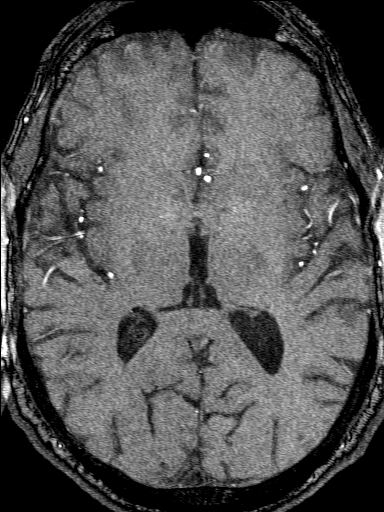
[im 122/148]
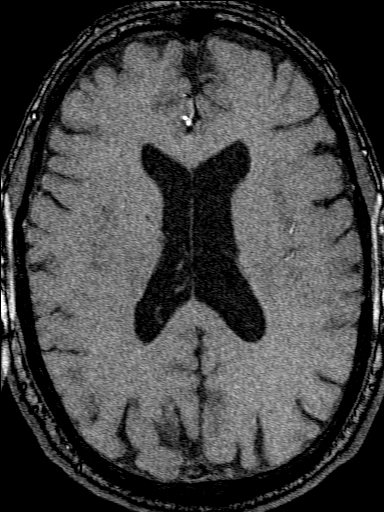
[im 126/148]
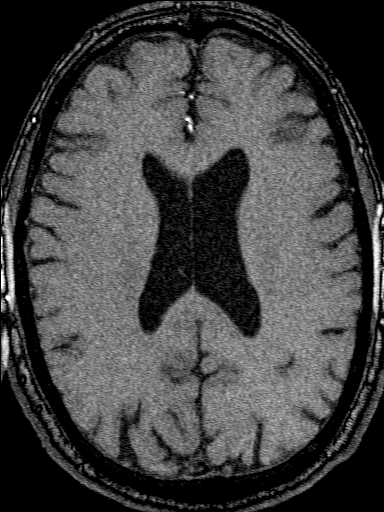
[im 141/148]
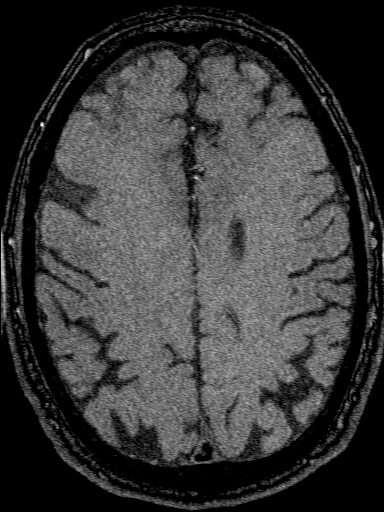

[25 of 48 positions shown; findings below may reference images not displayed]

FINDINGS: Exam is motion degraded.

Ectatic vertebral arteries more notable on the left. Right vertebral
artery with smooth narrowing after the takeoff of the right
posterior inferior cerebellar artery.

Ectatic basilar artery. Artifact versus mild narrowing proximal
basilar artery. The left posterior inferior cerebellar artery
approximates the proximal vertebral artery with fenestration a less
likely consideration.

Nonvisualized right anterior inferior cerebellar artery.

Mild narrowing and irregularity mid to distal superior cerebellar
artery bilaterally.

Mild to moderate narrowing and irregularity of the P2 -P3 segment of
the posterior cerebral arteries. Moderate narrowing and irregularity
distal posterior cerebral artery branches bilaterally.

Mild narrowing supraclinoid segment of the right internal carotid
artery.

Moderate narrowing versus artifact causing loss of signal middle
cerebral artery bifurcation bilaterally.

Mild to moderate narrowing middle cerebral artery branch vessels
bilaterally.

Artifact versus moderate narrowing proximal A2 segment anterior
cerebral arteries more notable on the right.

No aneurysm noted.

Limited imaging of brain parenchyma reveals mild global atrophy.
IMPRESSION: Intracranial atherosclerotic type changes noted as detailed above.
Motion degradation somewhat limits evaluation.

The cervical spine noted abnormality of the left vertebral artery is
felt to be related to dolichoectasia.

## 2016-10-26 ENCOUNTER — Encounter: Payer: Self-pay | Admitting: Medical Oncology

## 2016-10-26 ENCOUNTER — Emergency Department: Payer: Medicare Other

## 2016-10-26 ENCOUNTER — Emergency Department
Admission: EM | Admit: 2016-10-26 | Discharge: 2016-10-26 | Disposition: A | Discharge status: Home / Self Care | Payer: Medicare Other | Attending: Student in an Organized Health Care Education/Training Program | Admitting: Student in an Organized Health Care Education/Training Program

## 2016-10-26 DIAGNOSIS — I10 Essential (primary) hypertension: Secondary | ICD-10-CM | POA: Insufficient documentation

## 2016-10-26 DIAGNOSIS — I951 Orthostatic hypotension: Secondary | ICD-10-CM | POA: Insufficient documentation

## 2016-10-26 DIAGNOSIS — Z7982 Long term (current) use of aspirin: Secondary | ICD-10-CM | POA: Diagnosis not present

## 2016-10-26 DIAGNOSIS — R42 Dizziness and giddiness: Secondary | ICD-10-CM | POA: Diagnosis present

## 2016-10-26 DIAGNOSIS — Z87891 Personal history of nicotine dependence: Secondary | ICD-10-CM | POA: Insufficient documentation

## 2016-10-26 DIAGNOSIS — Z79899 Other long term (current) drug therapy: Secondary | ICD-10-CM | POA: Insufficient documentation

## 2016-10-26 LAB — BASIC METABOLIC PANEL
ANION GAP: 9 (ref 5–15)
ANION GAP: 9 (ref 5–15)
BUN: 34 mg/dL — ABNORMAL HIGH (ref 6–20)
BUN: 30 mg/dL — ABNORMAL HIGH (ref 6–20)
CALCIUM: 9.9 mg/dL (ref 8.9–10.3)
CO2: 26 mmol/L (ref 22–32)
CO2: 28 mmol/L (ref 22–32)
Calcium: 9 mg/dL (ref 8.9–10.3)
Chloride: 99 mmol/L — ABNORMAL LOW (ref 101–111)
Chloride: 100 mmol/L — ABNORMAL LOW (ref 101–111)
Creatinine, Ser: 2.09 mg/dL — ABNORMAL HIGH (ref 0.61–1.24)
Creatinine, Ser: 1.84 mg/dL — ABNORMAL HIGH (ref 0.61–1.24)
GFR calc Af Amer: 39 mL/min — ABNORMAL LOW (ref >60)
GFR, EST AFRICAN AMERICAN: 33 mL/min — AB (ref >60)
GFR, EST NON AFRICAN AMERICAN: 29 mL/min — AB (ref >60)
GFR, EST NON AFRICAN AMERICAN: 33 mL/min — AB (ref >60)
GLUCOSE: 181 mg/dL — AB (ref 65–99)
Glucose, Bld: 122 mg/dL — ABNORMAL HIGH (ref 65–99)
POTASSIUM: 3.8 mmol/L (ref 3.5–5.1)
Potassium: 4 mmol/L (ref 3.5–5.1)
SODIUM: 134 mmol/L — AB (ref 135–145)
Sodium: 137 mmol/L (ref 135–145)

## 2016-10-26 LAB — URINALYSIS, COMPLETE (UACMP) WITH MICROSCOPIC
Bacteria, UA: NONE SEEN
Bilirubin Urine: NEGATIVE
Glucose, UA: NEGATIVE mg/dL
Hgb urine dipstick: NEGATIVE
Ketones, ur: NEGATIVE mg/dL
Nitrite: NEGATIVE
PROTEIN: 30 mg/dL — AB
SPECIFIC GRAVITY, URINE: 1.019 (ref 1.005–1.030)
pH: 5 (ref 5.0–8.0)

## 2016-10-26 LAB — CBC
HCT: 42.5 % (ref 40.0–52.0)
HEMOGLOBIN: 15 g/dL (ref 13.0–18.0)
MCH: 34.8 pg — ABNORMAL HIGH (ref 26.0–34.0)
MCHC: 35.3 g/dL (ref 32.0–36.0)
MCV: 98.7 fL (ref 80.0–100.0)
PLATELETS: 208 10*3/uL (ref 150–440)
RBC: 4.31 MIL/uL — AB (ref 4.40–5.90)
RDW: 13.9 % (ref 11.5–14.5)
WBC: 7.9 10*3/uL (ref 3.8–10.6)

## 2016-10-26 MED ORDER — SODIUM CHLORIDE 0.9 % IV BOLUS (SEPSIS)
1000.0000 mL | Freq: Once | INTRAVENOUS | Status: AC
  Administered 2016-10-26: 1000 mL via INTRAVENOUS

## 2016-10-26 NOTE — ED Notes (Signed)
Pt states he is not able to urinate at this time, pt aware urine specimen is needed.

## 2016-10-26 NOTE — ED Notes (Signed)
Pt states is unable to urinate at this time.

## 2016-10-26 NOTE — ED Notes (Signed)

## 2016-10-26 NOTE — ED Provider Notes (Signed)
[facility] Emergency Department Provider Note    None    (approximate)  I have reviewed the triage vital signs and the nursing notes.   HISTORY  Chief Complaint Dizziness    HPI Jimmy Hines is a 81 y.o. male presents with lightheadedness when standing since last night. Patient states that he was out working in the heat on his yard mowing the lawn for over 5 hours yesterday. States that he thought he is drinking plenty of fluids put has felt like this in the past when he became dehydrated. Took his blood pressure this morning and noted that it was 23N systolic. Was feeling lightheaded when he is standing cities just simply took his time. Denied any chest pain or shortness of breath. No numbness or tingling. No headache.  No fevers. Did take his lisinopril to Toprol last night. Has not taken any medications today.   Past Medical History:  Diagnosis Date  . Arthritis   . BPH (benign prostatic hypertrophy)   . Dysrhythmia    ireg hr  . Full dentures   . GERD (gastroesophageal reflux disease)   . Hypertension   . Wears glasses    reading   No family history on file. Past Surgical History:  Procedure Laterality Date  . ABDOMINAL HERNIA REPAIR  2000  . CERVICAL LAMINECTOMY  1982  . COLONOSCOPY    . HERNIA REPAIR  1995   rt ing   . [place]   right  . SHOULDER ARTHROSCOPY WITH ROTATOR CUFF REPAIR Right 01/06/2013   Procedure: RIGHT SHOULDER ARTHROSCOPY WITH ROTATOR CUFF REPAIR POSSIBLE DISTAL CLAVICAL EXCISION;  Surgeon: Jolyn Nap, MD;  Location: [place];  Service: Orthopedics;  Laterality: Right;  . THUMB ARTHROSCOPY  2011   right and left  . TONSILLECTOMY     There are no active problems to display for this patient.     Prior to Admission medications   Medication Sig Start Date End Date Taking? Authorizing Provider  Ascorbic Acid (VITAMIN C PO) Take by mouth.    [provider]    aspirin 81 MG tablet Take 81 mg by mouth daily.    [provider]  celecoxib (CELEBREX) 100 MG capsule Take by mouth. 04/12/15   [provider]  lisinopril-hydrochlorothiazide (PRINZIDE,ZESTORETIC) 20-12.5 MG per tablet Take 1 tablet by mouth daily.    [provider]  metoprolol succinate (TOPROL-XL) 100 MG 24 hr tablet Take 100 mg by mouth daily. Take with or immediately following a meal.    [provider]  Multiple Vitamin (MULTIVITAMIN) capsule Take 1 capsule by mouth daily.    [provider]  NONFORMULARY OR COMPOUNDED ITEM Peripheral neuropathy cream-[facility] 12/01/15   Jimmy, Max Hines, DPM  omeprazole (PRILOSEC) 20 MG capsule Take 20 mg by mouth daily.    [provider]  tamsulosin (FLOMAX) 0.4 MG CAPS capsule Take 0.4 mg by mouth daily.    [provider]  VITAMIN E PO Take by mouth.    [provider]    Allergies Patient has no known allergies.    Social History Social History  Substance Use Topics  . Smoking status: Former Smoker    Quit date: 12/28/1982  . Smokeless tobacco: Not on file  . Alcohol use Yes     Comment: occ    Review of Systems Patient denies headaches, rhinorrhea, blurry vision, numbness, shortness of breath, chest pain, edema, cough, abdominal pain,  nausea, vomiting, diarrhea, dysuria, fevers, rashes or hallucinations unless otherwise stated above in HPI. ____________________________________________   PHYSICAL EXAM:  VITAL SIGNS: Vitals:   10/26/16 1707  BP: 95/61  Pulse: 75  Resp: 16  Temp: 98.1 F (36.7 C)  SpO2: 98%    Constitutional: Alert and oriented. Well appearing and in no acute distress. Eyes: Conjunctivae are normal.  Head: Atraumatic. Nose: No congestion/rhinnorhea. Mouth/Throat: Mucous membranes are moist.   Neck: No stridor. Painless ROM.  Cardiovascular: Normal rate, regular rhythm. Grossly normal heart sounds.  Good peripheral  circulation. Respiratory: Normal respiratory effort.  No retractions. Lungs CTAB. Gastrointestinal: Soft and nontender. No distention. No abdominal bruits. No CVA tenderness. Genitourinary:  Musculoskeletal: No lower extremity tenderness nor edema.  No joint effusions. Neurologic:  Normal speech and language. No gross focal neurologic deficits are appreciated. No facial droop Skin:  Skin is warm, dry and intact. No rash noted. Psychiatric: Mood and affect are normal. Speech and behavior are normal.  ____________________________________________   LABS (all labs ordered are listed, but only abnormal results are displayed)  Results for orders placed or performed during the hospital encounter of 10/26/16 (from the past 24 hour(s))  CBC     Status: Abnormal   Collection Time: 10/26/16  5:12 PM  Result Value Ref Range   WBC 7.9 3.8 - 10.6 K/uL   RBC 4.31 (L) 4.40 - 5.90 MIL/uL   Hemoglobin 15.0 13.0 - 18.0 g/dL   HCT 42.5 40.0 - 52.0 %   MCV 98.7 80.0 - 100.0 fL   MCH 34.8 (H) 26.0 - 34.0 pg   MCHC 35.3 32.0 - 36.0 g/dL   RDW 13.9 11.5 - 14.5 %   Platelets 208 150 - 440 K/uL   ____________________________________________  EKG My review and personal interpretation at Time: 17:12   Indication: hypotension  Rate: 70  Rhythm: sinus Axis: normal Other: normal intervals, no stemi,, non specific st changes, no depressions ____________________________________________  RADIOLOGY  I personally reviewed all radiographic images ordered to evaluate for the above acute complaints and reviewed radiology reports and findings.  These findings were personally discussed with the patient.  Please see medical record for radiology report.  ____________________________________________   PROCEDURES  Procedure(s) performed:  Procedures    Critical Care performed: no ____________________________________________   INITIAL IMPRESSION / ASSESSMENT AND PLAN / ED COURSE  Pertinent labs & imaging  results that were available during my care of the patient were reviewed by me and considered in my medical decision making (see chart for details).  DDX: dehydration, aki, chf, acs, sepsis  Jimmy Hines is a 81 y.o. who presents to the ED with hypotension and lightheadedness consistent with orthostatic symptoms as described above. He is otherwise afebrile and well perfused but gives a good story and history of dehydration. Blood work sent for the above differential shows that he does have some component of a kidney disease. Uncertain as to whether this is acute or chronic but the patient. Does not have any evidence of acidosis or hyperkalemia. We'll check urine. We'll provide IV fluids for his orthostasis and reassess.  Clinical Course as of Oct 27 2015  Thu Oct 26, 2016  1855 BUN: (!) 34 [PR]  2016 patient is feeling improved at this point. Patient will be signed out to Dr. Dineen Kid pending urinalysis and reassessment after IV fluids. If patient is a symptomatically improved after IV fluids will be appropriate for discharge home. If still orthostatic or symptomatic will be admitted for additional  IV fluids and workup.  [PR]    Clinical Course User Index [PR] Merlyn Lot, MD     ____________________________________________   FINAL CLINICAL IMPRESSION(S) / ED DIAGNOSES  Final diagnoses:  Dizziness  Orthostasis      NEW MEDICATIONS STARTED DURING THIS VISIT:  New Prescriptions   No medications on file     Note:  This document was prepared using Dragon voice recognition software and may include unintentional dictation errors.    Merlyn Lot, MD 10/26/16 2018

## 2016-10-26 NOTE — ED Notes (Signed)
Pt given sprite and sandwich tray, eating and drinking without difficulty.

## 2016-10-26 NOTE — ED Provider Notes (Signed)
signout from Dr. Quentin Cornwall in this 81 year old male who is returning with acute renal failure after being outside working in the heat one day ago. Plan is to follow-up with his urinalysis as well as a repeat BMP after fluids.  Physical Exam  BP 115/79 (BP Location: Left Arm)   Pulse 77   Temp 98.1 F (36.7 C) (Oral)   Resp 19   Wt 95.3 kg (210 lb)   SpO2 96%   BMI 29.29 kg/m  ----------------------------------------- 9:33 PM on 10/26/2016 -----------------------------------------   Physical Exam Patient at this time without any distress. Resting comfortably in the bed. ED Course  Procedures  MDM patient says that he is a symptomatic at this time without any lightheadedness or dizziness. He is able to stand and walk at the bedside without any orthostatic symptoms. We discussed his improving but not back to normal renal function and the need to follow-up with his primary care doctor as well as to stay well-hydrated. He is understanding of this plan and willing to comply.       Orbie Pyo, MD 10/26/16 2134

## 2016-10-26 NOTE — ED Triage Notes (Signed)
Pt reports yesterday he did lots of yard work in the heat, states that last night he began feeling somewhat dizzy upon standing. Pt went for eye appt today and his Dr told him that his bp was 43'O systolic. Pt denies pain, in NAD at this time.

## 2016-10-28 LAB — URINE CULTURE

## 2016-12-07 ENCOUNTER — Encounter: Payer: Self-pay | Admitting: *Deleted

## 2016-12-15 NOTE — Discharge Instructions (Signed)

## 2016-12-18 ENCOUNTER — Ambulatory Visit
Admission: RE | Admit: 2016-12-18 | Discharge: 2016-12-18 | Disposition: A | Discharge status: Home / Self Care | Payer: Medicare Other | Source: Ambulatory Visit | Attending: Ophthalmology | Admitting: Ophthalmology

## 2016-12-18 ENCOUNTER — Ambulatory Visit: Payer: Medicare Other | Admitting: Anesthesiology

## 2016-12-18 ENCOUNTER — Encounter: Payer: Self-pay | Admitting: *Deleted

## 2016-12-18 ENCOUNTER — Encounter
Admission: RE | Disposition: A | Discharge status: Home / Self Care | Payer: Self-pay | Source: Ambulatory Visit | Attending: Ophthalmology

## 2016-12-18 DIAGNOSIS — Z87891 Personal history of nicotine dependence: Secondary | ICD-10-CM | POA: Insufficient documentation

## 2016-12-18 DIAGNOSIS — I10 Essential (primary) hypertension: Secondary | ICD-10-CM | POA: Insufficient documentation

## 2016-12-18 DIAGNOSIS — H2512 Age-related nuclear cataract, left eye: Secondary | ICD-10-CM | POA: Insufficient documentation

## 2016-12-18 DIAGNOSIS — K219 Gastro-esophageal reflux disease without esophagitis: Secondary | ICD-10-CM | POA: Diagnosis not present

## 2016-12-18 DIAGNOSIS — Z7982 Long term (current) use of aspirin: Secondary | ICD-10-CM | POA: Diagnosis not present

## 2016-12-18 DIAGNOSIS — H5703 Miosis: Secondary | ICD-10-CM | POA: Insufficient documentation

## 2016-12-18 DIAGNOSIS — Z791 Long term (current) use of non-steroidal anti-inflammatories (NSAID): Secondary | ICD-10-CM | POA: Diagnosis not present

## 2016-12-18 DIAGNOSIS — Z79899 Other long term (current) drug therapy: Secondary | ICD-10-CM | POA: Insufficient documentation

## 2016-12-18 HISTORY — DX: Unspecified mononeuropathy of unspecified lower limb: G57.90

## 2016-12-18 HISTORY — PX: CATARACT EXTRACTION W/PHACO: SHX586

## 2016-12-18 SURGERY — PHACOEMULSIFICATION, CATARACT, WITH IOL INSERTION
Anesthesia: Monitor Anesthesia Care | Laterality: Left | Wound class: Clean

## 2016-12-18 MED ORDER — BRIMONIDINE TARTRATE-TIMOLOL 0.2-0.5 % OP SOLN
OPHTHALMIC | Status: DC | PRN
  Administered 2016-12-18: 1 [drp] via OPHTHALMIC

## 2016-12-18 MED ORDER — ARMC OPHTHALMIC DILATING DROPS
1.0000 "application " | OPHTHALMIC | Status: DC | PRN
  Administered 2016-12-18 (×3): 1 via OPHTHALMIC

## 2016-12-18 MED ORDER — FENTANYL CITRATE (PF) 100 MCG/2ML IJ SOLN
INTRAMUSCULAR | Status: DC | PRN
  Administered 2016-12-18: 50 ug via INTRAVENOUS

## 2016-12-18 MED ORDER — EPINEPHRINE PF 1 MG/ML IJ SOLN
INTRAMUSCULAR | Status: DC | PRN
  Administered 2016-12-18: 61 mL via OPHTHALMIC

## 2016-12-18 MED ORDER — NA HYALUR & NA CHOND-NA HYALUR 0.4-0.35 ML IO KIT
PACK | INTRAOCULAR | Status: DC | PRN
  Administered 2016-12-18: 1 mL via INTRAOCULAR

## 2016-12-18 MED ORDER — LACTATED RINGERS IV SOLN: 1000.0000 mL | INTRAVENOUS | Status: DC

## 2016-12-18 MED ORDER — LIDOCAINE HCL (PF) 2 % IJ SOLN
INTRAOCULAR | Status: DC | PRN
  Administered 2016-12-18: 1 mL via INTRAMUSCULAR

## 2016-12-18 MED ORDER — MOXIFLOXACIN HCL 0.5 % OP SOLN
1.0000 [drp] | OPHTHALMIC | Status: DC | PRN
  Administered 2016-12-18 (×3): 1 [drp] via OPHTHALMIC

## 2016-12-18 MED ORDER — MIDAZOLAM HCL 2 MG/2ML IJ SOLN
INTRAMUSCULAR | Status: DC | PRN
  Administered 2016-12-18: 2 mg via INTRAVENOUS

## 2016-12-18 MED ORDER — CEFUROXIME OPHTHALMIC INJECTION 1 MG/0.1 ML
INJECTION | OPHTHALMIC | Status: DC | PRN
  Administered 2016-12-18: 0.1 mL via INTRACAMERAL

## 2016-12-18 SURGICAL SUPPLY — 19 items
CANNULA ANT/CHMB 27GA (MISCELLANEOUS) ×3 IMPLANT
GLOVE SURG LX 7.5 STRW (GLOVE) ×2
GLOVE SURG LX STRL 7.5 STRW (GLOVE) ×1 IMPLANT
GLOVE SURG TRIUMPH 8.0 PF LTX (GLOVE) ×3 IMPLANT
GOWN STRL REUS W/ TWL LRG LVL3 (GOWN DISPOSABLE) ×2 IMPLANT
GOWN STRL REUS W/TWL LRG LVL3 (GOWN DISPOSABLE) ×4
LENS IOL TECNIS ITEC 20.0 (Intraocular Lens) ×3 IMPLANT
MARKER SKIN DUAL TIP RULER LAB (MISCELLANEOUS) ×3 IMPLANT
NEEDLE FILTER BLUNT 18X 1/2SAF (NEEDLE) ×2
NEEDLE FILTER BLUNT 18X1 1/2 (NEEDLE) ×1 IMPLANT
PACK CATARACT BRASINGTON (MISCELLANEOUS) ×3 IMPLANT
PACK EYE AFTER SURG (MISCELLANEOUS) ×3 IMPLANT
PACK OPTHALMIC (MISCELLANEOUS) ×3 IMPLANT
RING MALYGIN 7.0 (MISCELLANEOUS) ×3 IMPLANT
SYR 3ML LL SCALE MARK (SYRINGE) ×3 IMPLANT
SYR 5ML LL (SYRINGE) ×3 IMPLANT
SYR TB 1ML LUER SLIP (SYRINGE) ×3 IMPLANT
WATER STERILE IRR 250ML POUR (IV SOLUTION) ×3 IMPLANT
WIPE NON LINTING 3.25X3.25 (MISCELLANEOUS) ×3 IMPLANT

## 2016-12-18 NOTE — Anesthesia Preprocedure Evaluation (Addendum)
Anesthesia Evaluation  Patient identified by MRN, date of birth, ID band Patient awake    Reviewed: Allergy & Precautions, NPO status , Patient's Chart, lab work & pertinent test results, reviewed documented beta blocker date and time   Airway Mallampati: II  TM Distance: >3 FB Neck ROM: Full    Dental no notable dental hx.    Pulmonary former smoker,    Pulmonary exam normal breath sounds clear to auscultation       Cardiovascular hypertension, Normal cardiovascular exam Rhythm:Regular Rate:Normal     Neuro/Psych  Neuromuscular disease (Cervical radiculitis) negative psych ROS   GI/Hepatic Neg liver ROS, GERD  ,  Endo/Other  negative endocrine ROS  Renal/GU negative Renal ROS  negative genitourinary   Musculoskeletal  (+) Arthritis ,   Abdominal Normal abdominal exam  (+)  Abdomen: soft.    Peds  Hematology negative hematology ROS (+)   Anesthesia Other Findings   Reproductive/Obstetrics                            Anesthesia Physical Anesthesia Plan  ASA: II  Anesthesia Plan: MAC   Post-op Pain Management:    Induction: Intravenous  PONV Risk Score and Plan:   Airway Management Planned: Nasal Cannula  Additional Equipment: None  Intra-op Plan:   Post-operative Plan:   Informed Consent: I have reviewed the patients History and Physical, chart, labs and discussed the procedure including the risks, benefits and alternatives for the proposed anesthesia with the patient or authorized representative who has indicated his/her understanding and acceptance.     Plan Discussed with: CRNA, Anesthesiologist and Surgeon  Anesthesia Plan Comments:         Anesthesia Quick Evaluation

## 2016-12-18 NOTE — Transfer of Care (Signed)
Immediate Anesthesia Transfer of Care Note  Patient: Jimmy Hines  Procedure(s) Performed: CATARACT EXTRACTION PHACO AND INTRAOCULAR LENS PLACEMENT (IOC) LEFT (Left )  Patient Location: PACU  Anesthesia Type: MAC  Level of Consciousness: awake, alert  and patient cooperative  Airway and Oxygen Therapy: Patient Spontanous Breathing and Patient connected to supplemental oxygen  Post-op Assessment: Post-op Vital signs reviewed, Patient's Cardiovascular Status Stable, Respiratory Function Stable, Patent Airway and No signs of Nausea or vomiting  Post-op Vital Signs: Reviewed and stable  Complications: No apparent anesthesia complications

## 2016-12-18 NOTE — Anesthesia Postprocedure Evaluation (Signed)
Anesthesia Post Note  Patient: Jimmy Hines  Procedure(s) Performed: CATARACT EXTRACTION PHACO AND INTRAOCULAR LENS PLACEMENT (IOC) LEFT (Left )  Patient location during evaluation: PACU Anesthesia Type: MAC Level of consciousness: awake Pain management: pain level controlled Vital Signs Assessment: post-procedure vital signs reviewed and stable Respiratory status: spontaneous breathing Cardiovascular status: blood pressure returned to baseline Postop Assessment: no headache Anesthetic complications: no    Lavonna Monarch

## 2016-12-18 NOTE — Op Note (Signed)
OPERATIVE NOTE  DAYLIN GRUSZKA [id number] 12/18/2016  PREOPERATIVE DIAGNOSIS:   Nuclear sclerotic cataract left eye with miotic pupil      H25.12   POSTOPERATIVE DIAGNOSIS:   Nuclear sclerotic cataract left eye with miotic pupil.     PROCEDURE:  Phacoemulsification with posterior chamber intraocular lens implantation of the left eye which required pupil stretching with the Malyugin pupil expansion device   LENS:   Implant Name Type Inv. Item Serial No. Manufacturer Lot No. LRB No. Used  LENS IOL DIOP 20.0 - [id number] Intraocular Lens LENS IOL DIOP 20.0 [id number] AMO  Left 1        ULTRASOUND TIME: 18 % of 1 minutes, 13 seconds.  CDE 13.5   SURGEON:  Wyonia Hough, MD   ANESTHESIA: Topical with tetracaine drops and 2% Xylocaine jelly, augmented with 1% preservative-free intracameral lidocaine.   COMPLICATIONS:  None.   DESCRIPTION OF PROCEDURE:  The patient was identified in the holding room and transported to the operating room and placed in the supine position under the operating microscope.  The left eye was identified as the operative eye and it was prepped and draped in the usual sterile ophthalmic fashion.   A 1 millimeter clear-corneal paracentesis was made at the 1:30 position.  The anterior chamber was filled with Viscoat viscoelastic.  0.5 ml of preservative-free 1% lidocaine was injected into the anterior chamber.  A 2.4 millimeter keratome was used to make a near-clear corneal incision at the 10:30 position.  A Malyugin pupil expander was then placed through the main incision and into the anterior chamber of the eye.  The edge of the iris was secured on the lip of the pupil expander and it was released, thereby expanding the pupil to approximately 8 millimeters for completion of the cataract surgery.  Additional Viscoat was placed in the anterior chamber.  A cystotome and capsulorrhexis forceps were used to make a capsulorrhexis. It was discontinuous wit ha radial  tear, completed with a cystotome and scissors.    Balanced salt solution was used to hydrodissect and hydrodelineate the lens nucleus.   Phacoemulsification was used in stop and chop fashion to remove the lens, nucleus and epinucleus.  The remaining cortex was aspirated using the irrigation aspiration handpiece.  Additional Provisc was placed into the eye to distend the capsular bag for lens placement.  A lens was then injected into the capsular bag.  The pupil expanding ring was removed using a Kuglen hook and insertion device. The remaining viscoelastic was aspirated from the capsular bag and the anterior chamber.  The anterior chamber was filled with balanced salt solution to inflate to a physiologic pressure.   Wounds were hydrated with balanced salt solution.  The anterior chamber was inflated to a physiologic pressure with balanced salt solution.  No wound leaks were noted. Cefuroxime 0.1 ml of a 10mg /ml solution was injected into the anterior chamber for a dose of 1 mg of intracameral antibiotic at the completion of the case.   Timolol and Brimonidine drops were applied to the eye.  The patient was taken to the recovery room in stable condition without complications of anesthesia or surgery.  Okie Bogacz 12/18/2016, 10:57 AM

## 2016-12-18 NOTE — H&P (Signed)
The History and Physical notes are on paper, have been signed, and are to be scanned. The patient remains stable and unchanged from the H&P.   Previous H&P reviewed, patient examined, and there are no changes.  Jimmy Hines 12/18/2016 9:56 AM

## 2016-12-19 ENCOUNTER — Encounter: Payer: Self-pay | Admitting: Ophthalmology

## 2017-01-26 ENCOUNTER — Encounter: Payer: Self-pay | Admitting: *Deleted

## 2017-02-01 ENCOUNTER — Encounter: Payer: Self-pay | Admitting: *Deleted

## 2017-02-01 ENCOUNTER — Encounter
Admission: RE | Disposition: A | Discharge status: Home / Self Care | Payer: Self-pay | Source: Ambulatory Visit | Attending: Ophthalmology

## 2017-02-01 ENCOUNTER — Other Ambulatory Visit: Payer: Self-pay

## 2017-02-01 ENCOUNTER — Ambulatory Visit
Admission: RE | Admit: 2017-02-01 | Discharge: 2017-02-01 | Disposition: A | Discharge status: Home / Self Care | Payer: Medicare Other | Source: Ambulatory Visit | Attending: Ophthalmology | Admitting: Ophthalmology

## 2017-02-01 ENCOUNTER — Ambulatory Visit: Payer: Medicare Other | Admitting: Certified Registered Nurse Anesthetist

## 2017-02-01 DIAGNOSIS — Z87891 Personal history of nicotine dependence: Secondary | ICD-10-CM | POA: Insufficient documentation

## 2017-02-01 DIAGNOSIS — M1990 Unspecified osteoarthritis, unspecified site: Secondary | ICD-10-CM | POA: Insufficient documentation

## 2017-02-01 DIAGNOSIS — H2511 Age-related nuclear cataract, right eye: Secondary | ICD-10-CM | POA: Diagnosis not present

## 2017-02-01 DIAGNOSIS — K219 Gastro-esophageal reflux disease without esophagitis: Secondary | ICD-10-CM | POA: Diagnosis not present

## 2017-02-01 DIAGNOSIS — I10 Essential (primary) hypertension: Secondary | ICD-10-CM | POA: Insufficient documentation

## 2017-02-01 HISTORY — DX: Unspecified hearing loss, unspecified ear: H91.90

## 2017-02-01 HISTORY — PX: CATARACT EXTRACTION W/PHACO: SHX586

## 2017-02-01 SURGERY — PHACOEMULSIFICATION, CATARACT, WITH IOL INSERTION
Anesthesia: Monitor Anesthesia Care | Site: Eye | Laterality: Right | Wound class: Clean

## 2017-02-01 MED ORDER — MOXIFLOXACIN HCL 0.5 % OP SOLN
1.0000 [drp] | OPHTHALMIC | Status: AC
  Administered 2017-02-01 (×3): 1 [drp] via OPHTHALMIC

## 2017-02-01 MED ORDER — POVIDONE-IODINE 5 % OP SOLN
OPHTHALMIC | Status: AC
Start: 2017-02-01 — End: ?
  Filled 2017-02-01: qty 30

## 2017-02-01 MED ORDER — EPINEPHRINE PF 1 MG/ML IJ SOLN
INTRAMUSCULAR | Status: AC
  Filled 2017-02-01: qty 1

## 2017-02-01 MED ORDER — ARMC OPHTHALMIC DILATING DROPS
1.0000 "application " | OPHTHALMIC | Status: AC
  Administered 2017-02-01 (×3): 1 via OPHTHALMIC

## 2017-02-01 MED ORDER — BSS IO SOLN
INTRAOCULAR | Status: DC | PRN
  Administered 2017-02-01: 200 mL via OPHTHALMIC

## 2017-02-01 MED ORDER — SODIUM CHLORIDE 0.9 % IV SOLN
INTRAVENOUS | Status: DC
  Administered 2017-02-01: 09:00:00 via INTRAVENOUS

## 2017-02-01 MED ORDER — LIDOCAINE HCL (PF) 4 % IJ SOLN
INTRAMUSCULAR | Status: AC
  Filled 2017-02-01: qty 5

## 2017-02-01 MED ORDER — FENTANYL CITRATE (PF) 100 MCG/2ML IJ SOLN
INTRAMUSCULAR | Status: DC | PRN
  Administered 2017-02-01: 50 ug via INTRAVENOUS
  Administered 2017-02-01: 25 ug via INTRAVENOUS

## 2017-02-01 MED ORDER — FENTANYL CITRATE (PF) 100 MCG/2ML IJ SOLN
INTRAMUSCULAR | Status: AC
  Filled 2017-02-01: qty 2

## 2017-02-01 MED ORDER — POVIDONE-IODINE 5 % OP SOLN
OPHTHALMIC | Status: DC | PRN
  Administered 2017-02-01: 1 via OPHTHALMIC

## 2017-02-01 MED ORDER — CARBACHOL 0.01 % IO SOLN
INTRAOCULAR | Status: DC | PRN
  Administered 2017-02-01: 0.5 mL via INTRAOCULAR

## 2017-02-01 MED ORDER — BACITRACIN-NEOMYCIN-POLYMYXIN 400-5-5000 EX OINT
TOPICAL_OINTMENT | CUTANEOUS | Status: AC
  Filled 2017-02-01: qty 1

## 2017-02-01 MED ORDER — NA HYALUR & NA CHOND-NA HYALUR 0.4-0.35 ML IO KIT
PACK | INTRAOCULAR | Status: DC | PRN
  Administered 2017-02-01: .35 mL via INTRAOCULAR

## 2017-02-01 MED ORDER — MOXIFLOXACIN HCL 0.5 % OP SOLN
OPHTHALMIC | Status: AC
  Filled 2017-02-01: qty 3

## 2017-02-01 MED ORDER — NEOMYCIN-POLYMYXIN-DEXAMETH 0.1 % OP OINT
TOPICAL_OINTMENT | OPHTHALMIC | Status: DC | PRN
  Administered 2017-02-01: 1 via OPHTHALMIC

## 2017-02-01 MED ORDER — ARMC OPHTHALMIC DILATING DROPS
OPHTHALMIC | Status: AC
  Filled 2017-02-01: qty 0.4

## 2017-02-01 MED ORDER — LIDOCAINE HCL (PF) 4 % IJ SOLN
INTRAOCULAR | Status: DC | PRN
  Administered 2017-02-01: 1 mL via OPHTHALMIC

## 2017-02-01 SURGICAL SUPPLY — 17 items
GLOVE BIO SURGEON STRL SZ8 (GLOVE) ×3 IMPLANT
GLOVE BIOGEL M 6.5 STRL (GLOVE) ×3 IMPLANT
GLOVE SURG LX 7.5 STRW (GLOVE) ×2
GLOVE SURG LX STRL 7.5 STRW (GLOVE) ×1 IMPLANT
GOWN STRL REUS W/ TWL LRG LVL3 (GOWN DISPOSABLE) ×2 IMPLANT
GOWN STRL REUS W/TWL LRG LVL3 (GOWN DISPOSABLE) ×4
LABEL CATARACT MEDS ST (LABEL) ×3 IMPLANT
LENS IOL TECNIS ITEC 19.5 (Intraocular Lens) ×3 IMPLANT
PACK CATARACT (MISCELLANEOUS) ×3 IMPLANT
PACK CATARACT BRASINGTON LX (MISCELLANEOUS) ×3 IMPLANT
PACK EYE AFTER SURG (MISCELLANEOUS) ×3 IMPLANT
RING MALYGIN (MISCELLANEOUS) ×3 IMPLANT
SOL BSS BAG (MISCELLANEOUS) ×3
SOLUTION BSS BAG (MISCELLANEOUS) ×1 IMPLANT
SYR 5ML LL (SYRINGE) ×3 IMPLANT
WATER STERILE IRR 250ML POUR (IV SOLUTION) ×3 IMPLANT
WIPE NON LINTING 3.25X3.25 (MISCELLANEOUS) ×3 IMPLANT

## 2017-02-01 NOTE — Anesthesia Preprocedure Evaluation (Signed)
Anesthesia Evaluation  Patient identified by MRN, date of birth, ID band Patient awake    Reviewed: Allergy & Precautions, NPO status , Patient's Chart, lab work & pertinent test results, reviewed documented beta blocker date and time   History of Anesthesia Complications Negative for: history of anesthetic complications  Airway Mallampati: II  TM Distance: >3 FB Neck ROM: Full    Dental no notable dental hx.    Pulmonary former smoker,           Cardiovascular hypertension,      Neuro/Psych  Neuromuscular disease (Cervical radiculitis) negative psych ROS   GI/Hepatic Neg liver ROS, GERD  ,  Endo/Other  negative endocrine ROS  Renal/GU negative Renal ROS  negative genitourinary   Musculoskeletal  (+) Arthritis ,   Abdominal Normal abdominal exam  (+)  Abdomen: soft.    Peds  Hematology negative hematology ROS (+)   Anesthesia Other Findings   Reproductive/Obstetrics                             Anesthesia Physical  Anesthesia Plan  ASA: II  Anesthesia Plan: MAC   Post-op Pain Management:    Induction: Intravenous  PONV Risk Score and Plan: 1 and Ondansetron  Airway Management Planned: Nasal Cannula  Additional Equipment: None  Intra-op Plan:   Post-operative Plan:   Informed Consent: I have reviewed the patients History and Physical, chart, labs and discussed the procedure including the risks, benefits and alternatives for the proposed anesthesia with the patient or authorized representative who has indicated his/her understanding and acceptance.     Plan Discussed with: CRNA, Anesthesiologist and Surgeon  Anesthesia Plan Comments:         Anesthesia Quick Evaluation

## 2017-02-01 NOTE — Anesthesia Procedure Notes (Signed)
Procedure Name: MAC Performed by: Echo Propp, CRNA Pre-anesthesia Checklist: Patient identified, Emergency Drugs available, Suction available, Patient being monitored and Timeout performed Oxygen Delivery Method: Nasal cannula       

## 2017-02-01 NOTE — Anesthesia Post-op Follow-up Note (Signed)
Anesthesia QCDR form completed.        

## 2017-02-01 NOTE — Discharge Instructions (Signed)
Eye Surgery Discharge Instructions  Expect mild scratchy sensation or mild soreness. DO NOT RUB YOUR EYE!  The day of surgery:  Minimal physical activity, but bed rest is not required  No reading, computer work, or close hand work  No bending, lifting, or straining.  May watch TV  For 24 hours:  No driving, legal decisions, or alcoholic beverages  Safety precautions  Eat anything you prefer: It is better to start with liquids, then soup then solid foods.  _____ Eye patch should be worn until postoperative exam tomorrow.  ____ Solar shield eyeglasses should be worn for comfort in the sunlight/patch while sleeping  Resume all regular medications including aspirin or Coumadin if these were discontinued prior to surgery. You may shower, bathe, shave, or wash your hair. Tylenol may be taken for mild discomfort.  Call your doctor if you experience significant pain, nausea, or vomiting, fever > 101 or other signs of infection. [phone] or [phone] Specific instructions:  Follow-up Information    Leandrew Koyanagi, MD Follow up.   Specialty:  Ophthalmology Why:  December 28 at 9:25am Contact information: [address]   [place] [address] [phone]

## 2017-02-01 NOTE — Transfer of Care (Signed)
Immediate Anesthesia Transfer of Care Note  Patient: Jimmy Hines  Procedure(s) Performed: CATARACT EXTRACTION PHACO AND INTRAOCULAR LENS PLACEMENT (IOC) (Right Eye)  Patient Location: PACU  Anesthesia Type:MAC  Level of Consciousness: awake, alert  and oriented  Airway & Oxygen Therapy: Patient Spontanous Breathing  Post-op Assessment: Report given to RN and Post -op Vital signs reviewed and stable  Post vital signs: Reviewed and stable  Last Vitals:  Vitals:   02/01/17 0841  BP: 118/72  Pulse: (!) 51  Resp: 16  Temp: (!) 35.6 C  SpO2: 97%    Last Pain:  Vitals:   02/01/17 0841  TempSrc: Tympanic         Complications: No apparent anesthesia complications

## 2017-02-01 NOTE — Anesthesia Postprocedure Evaluation (Signed)
Anesthesia Post Note  Patient: Jimmy Hines  Procedure(s) Performed: CATARACT EXTRACTION PHACO AND INTRAOCULAR LENS PLACEMENT (IOC) (Right Eye)  Patient location during evaluation: PACU Anesthesia Type: MAC Level of consciousness: awake and alert and oriented Pain management: satisfactory to patient Vital Signs Assessment: post-procedure vital signs reviewed and stable Respiratory status: respiratory function stable Cardiovascular status: stable Anesthetic complications: no     Last Vitals:  Vitals:   02/01/17 0841  BP: 118/72  Pulse: (!) 51  Resp: 16  Temp: (!) 35.6 C  SpO2: 97%    Last Pain:  Vitals:   02/01/17 0841  TempSrc: Tympanic                 Blima Singer

## 2017-02-01 NOTE — H&P (Signed)
The History and Physical notes are on paper, have been signed, and are to be scanned. The patient remains stable and unchanged from the H&P.   Previous H&P reviewed, patient examined, and there are change as noted: Added PO Gabapentin  Barb Shear 02/01/2017 9:03 AM

## 2017-02-01 NOTE — Op Note (Signed)
OPERATIVE NOTE  KHYRIE MASI [id number] 02/01/2017   PREOPERATIVE DIAGNOSIS:    Nuclear Sclerotic Cataract Right eye with miotic pupil.        H25.11  POSTOPERATIVE DIAGNOSIS: Nuclear Sclerotic Cataract Right eye with miotic pupil.          PROCEDURE:  Phacoemusification with posterior chamber intraocular lens placement of the right eye which required pupil stretching with the Malyugin pupil expansion device.  LENS:   Implant Name Type Inv. Item Serial No. Manufacturer Lot No. LRB No. Used  LENS IOL DIOP 19.5 - [id number] Intraocular Lens LENS IOL DIOP 19.5 [id number] AMO  Right 1       ULTRASOUND TIME: 15 % of 0 minutes 42 seconds, CDE 6.2  SURGEON:  Wyonia Hough, MD   ANESTHESIA:  Topical with tetracaine drops and 2% Xylocaine jelly, augmented with 1% preservative-free intracameral lidocaine.   COMPLICATIONS:  None.   DESCRIPTION OF PROCEDURE:  The patient was identified in the holding room and transported to the operating room and placed in the supine position under the operating microscope. Theright eye was identified as the operative eye and it was prepped and draped in the usual sterile ophthalmic fashion.   A 1 millimeter clear-corneal paracentesis was made at the 12:00 position.  0.5 ml of preservative-free 1% lidocaine was injected into the anterior chamber. The anterior chamber was filled with Viscoat viscoelastic.  A 2.4 millimeter keratome was used to make a near-clear corneal incision at the 9:00 position. A Malyugin pupil expander was then placed through the main incision and into the anterior chamber of the eye.  The edge of the iris was secured on the lip of the pupil expander and it was released, thereby expanding the pupil to approximately 7 millimeters for completion of the cataract surgery.  Additional Viscoat was placed in the anterior chamber.  A cystotome and capsulorrhexis forceps were used to make a curvilinear capsulorrhexis.   Balanced salt  solution was used to hydrodissect and hydrodelineate the lens nucleus.   Phacoemulsification was used in stop and chop fashion to remove the lens, nucleus and epinucleus.  The remaining cortex was aspirated using the irrigation aspiration handpiece.  Additional Provisc was placed into the eye to distend the capsular bag for lens placement.  A lens was then injected into the capsular bag.  The pupil expanding ring was removed using a hook and insertion device. The remaining viscoelastic was aspirated from the capsular bag and the anterior chamber.  The anterior chamber was filled with balanced salt solution to inflate to a physiologic pressure.  Wounds were hydrated with balanced salt solution.  The anterior chamber was inflated to a physiologic pressure with balanced salt solution.  Miostat was placed into the anterior chamber.  No wound leaks were noted.Vigamox 0.2 ml of a 1mg  per ml solution was injected into the anterior chamber for a dose of 0.2 mg of intracameral antibiotic at the completion of the case. Maxitrol ointment was applied to the eye.  The patient was taken to the recovery room in stable condition without complications of anesthesia or surgery.  Merlon Alcorta 02/01/2017, 10:29 AM

## 2017-02-20 ENCOUNTER — Encounter
Admission: RE | Admit: 2017-02-20 | Discharge: 2017-02-20 | Disposition: A | Discharge status: Home / Self Care | Payer: Medicare Other | Source: Ambulatory Visit | Attending: Podiatry | Admitting: Podiatry

## 2017-02-20 ENCOUNTER — Other Ambulatory Visit: Payer: Self-pay | Admitting: Podiatry

## 2017-02-20 ENCOUNTER — Other Ambulatory Visit: Payer: Self-pay

## 2017-02-20 DIAGNOSIS — Z87891 Personal history of nicotine dependence: Secondary | ICD-10-CM | POA: Diagnosis not present

## 2017-02-20 DIAGNOSIS — G629 Polyneuropathy, unspecified: Secondary | ICD-10-CM | POA: Diagnosis not present

## 2017-02-20 DIAGNOSIS — I10 Essential (primary) hypertension: Secondary | ICD-10-CM | POA: Diagnosis not present

## 2017-02-20 DIAGNOSIS — Z791 Long term (current) use of non-steroidal anti-inflammatories (NSAID): Secondary | ICD-10-CM | POA: Diagnosis not present

## 2017-02-20 DIAGNOSIS — K219 Gastro-esophageal reflux disease without esophagitis: Secondary | ICD-10-CM | POA: Diagnosis not present

## 2017-02-20 DIAGNOSIS — M2041 Other hammer toe(s) (acquired), right foot: Secondary | ICD-10-CM | POA: Diagnosis not present

## 2017-02-20 DIAGNOSIS — Z79899 Other long term (current) drug therapy: Secondary | ICD-10-CM | POA: Diagnosis not present

## 2017-02-20 DIAGNOSIS — M2011 Hallux valgus (acquired), right foot: Secondary | ICD-10-CM | POA: Diagnosis present

## 2017-02-20 DIAGNOSIS — Z7982 Long term (current) use of aspirin: Secondary | ICD-10-CM | POA: Diagnosis not present

## 2017-02-20 LAB — BASIC METABOLIC PANEL
Anion gap: 8 (ref 5–15)
BUN: 16 mg/dL (ref 6–20)
CALCIUM: 9.4 mg/dL (ref 8.9–10.3)
CO2: 26 mmol/L (ref 22–32)
CREATININE: 1.04 mg/dL (ref 0.61–1.24)
Chloride: 102 mmol/L (ref 101–111)
Glucose, Bld: 110 mg/dL — ABNORMAL HIGH (ref 65–99)
Potassium: 3.9 mmol/L (ref 3.5–5.1)
SODIUM: 136 mmol/L (ref 135–145)

## 2017-02-20 LAB — CBC
HCT: 43.6 % (ref 40.0–52.0)
Hemoglobin: 15 g/dL (ref 13.0–18.0)
MCH: 34.2 pg — ABNORMAL HIGH (ref 26.0–34.0)
MCHC: 34.4 g/dL (ref 32.0–36.0)
MCV: 99.2 fL (ref 80.0–100.0)
Platelets: 179 10*3/uL (ref 150–440)
RBC: 4.4 MIL/uL (ref 4.40–5.90)
RDW: 13.9 % (ref 11.5–14.5)
WBC: 5.8 10*3/uL (ref 3.8–10.6)

## 2017-02-20 NOTE — Patient Instructions (Signed)
Your procedure is scheduled on: Friday 02/23/17 Report to [place]. 2ND FLOOR MEDICAL MALL ENTRANCE. To find out your arrival time please call [phone] between 1PM - 3PM on Thursday 02/22/17.  Remember: Instructions that are not followed completely may result in serious medical risk, up to and including death, or upon the discretion of your surgeon and anesthesiologist your surgery may need to be rescheduled.    __X__ 1. Do not eat anything after midnight the night before your    procedure.  No gum chewing or hard candies.  You may drink clear   liquids up to 2 hours before you are scheduled to arrive at the   hospital for your procedure. Do not drink clear liquids within 2   hours of scheduled arrival to the hospital as this may lead to your   procedure being delayed or rescheduled.       Clear liquids include:   Water or Apple juice without pulp   Clear carbohydrate beverage such as Clearfast or Gatorade   Black coffee or Clear Tea (no milk, no creamer, do not add anything   to the coffee or tea)    Diabetics should only drink water   __X__ 2. No Alcohol for 24 hours before or after surgery.   ____ 3. Bring all medications with you on the day of surgery if instructed.    __X__ 4. Notify your doctor if there is any change in your medical condition     (cold, fever, infections).             __X___5. No smoking within 24 hours of your surgery.     Do not wear jewelry, make-up, hairpins, clips or nail polish.  Do not wear lotions, powders, or perfumes.   Do not shave 48 hours prior to surgery. Men may shave face and neck.  Do not bring valuables to the hospital.    [facility] is not responsible for any belongings or valuables.               Contacts, dentures or bridgework may not be worn into surgery.  Leave your suitcase in the car. After surgery it may be brought to your room.  For patients admitted to the hospital, discharge time is determined by your                 treatment team.   Patients discharged the day of surgery will not be allowed to drive home.   Please read over the following fact sheets that you were given:   MRSA Information   __X__ Take these medicines the morning of surgery with A SIP OF WATER:    1. OMEPRAZOLE  2.   3.   4.  5.  6.  ____ Fleet Enema (as directed)   __X__ Use CHG Soap/SAGE wipes as directed  ____ Use inhalers on the day of surgery  ____ Stop metformin 2 days prior to surgery    ____ Take 1/2 of usual insulin dose the night before surgery and none on the morning of surgery.   __X__ Stop Coumadin/Plavix/aspirin on CONTACT DR FOWLER'S OFFICE ABOUT STOPPING ASPIRIN  __X__ Stop Anti-inflammatories such as Advil, Aleve, Ibuprofen, Motrin, Naproxen, Naprosyn, Goodies,powder, or aspirin products.  OK to take Tylenol.   __X__ Stop supplements, Vitamin E, Fish Oil until after surgery.    ____ Bring C-Pap to the hospital.

## 2017-02-23 ENCOUNTER — Ambulatory Visit: Payer: Medicare Other | Admitting: Anesthesiology

## 2017-02-23 ENCOUNTER — Ambulatory Visit
Admission: RE | Admit: 2017-02-23 | Discharge: 2017-02-23 | Disposition: A | Discharge status: Home / Self Care | Payer: Medicare Other | Source: Ambulatory Visit | Attending: Podiatry | Admitting: Podiatry

## 2017-02-23 ENCOUNTER — Encounter
Admission: RE | Disposition: A | Discharge status: Home / Self Care | Payer: Self-pay | Source: Ambulatory Visit | Attending: Podiatry

## 2017-02-23 DIAGNOSIS — G629 Polyneuropathy, unspecified: Secondary | ICD-10-CM | POA: Insufficient documentation

## 2017-02-23 DIAGNOSIS — M2041 Other hammer toe(s) (acquired), right foot: Secondary | ICD-10-CM | POA: Insufficient documentation

## 2017-02-23 DIAGNOSIS — K219 Gastro-esophageal reflux disease without esophagitis: Secondary | ICD-10-CM | POA: Insufficient documentation

## 2017-02-23 DIAGNOSIS — Z79899 Other long term (current) drug therapy: Secondary | ICD-10-CM | POA: Insufficient documentation

## 2017-02-23 DIAGNOSIS — Z791 Long term (current) use of non-steroidal anti-inflammatories (NSAID): Secondary | ICD-10-CM | POA: Insufficient documentation

## 2017-02-23 DIAGNOSIS — M2011 Hallux valgus (acquired), right foot: Secondary | ICD-10-CM | POA: Diagnosis not present

## 2017-02-23 DIAGNOSIS — Z87891 Personal history of nicotine dependence: Secondary | ICD-10-CM | POA: Insufficient documentation

## 2017-02-23 DIAGNOSIS — Z7982 Long term (current) use of aspirin: Secondary | ICD-10-CM | POA: Insufficient documentation

## 2017-02-23 DIAGNOSIS — I10 Essential (primary) hypertension: Secondary | ICD-10-CM | POA: Insufficient documentation

## 2017-02-23 HISTORY — PX: HALLUX VALGUS AUSTIN: SHX6623

## 2017-02-23 HISTORY — PX: HAMMER TOE SURGERY: SHX385

## 2017-02-23 SURGERY — CORRECTION, HALLUX VALGUS
Anesthesia: General | Laterality: Right

## 2017-02-23 MED ORDER — OXYCODONE HCL 5 MG PO TABS
5.0000 mg | ORAL_TABLET | Freq: Once | ORAL | Status: DC | PRN
Start: 2017-02-23 — End: 2017-02-23

## 2017-02-23 MED ORDER — PROMETHAZINE HCL 25 MG/ML IJ SOLN: 6.2500 mg | INTRAMUSCULAR | Status: DC | PRN

## 2017-02-23 MED ORDER — BUPIVACAINE HCL (PF) 0.25 % IJ SOLN
INTRAMUSCULAR | Status: AC
  Filled 2017-02-23: qty 30

## 2017-02-23 MED ORDER — PROPOFOL 10 MG/ML IV BOLUS
INTRAVENOUS | Status: DC | PRN
  Administered 2017-02-23: 180 mg via INTRAVENOUS

## 2017-02-23 MED ORDER — BUPIVACAINE LIPOSOME 1.3 % IJ SUSP
INTRAMUSCULAR | Status: DC | PRN
  Administered 2017-02-23: 20 mL

## 2017-02-23 MED ORDER — EPHEDRINE SULFATE 50 MG/ML IJ SOLN
INTRAMUSCULAR | Status: DC | PRN
  Administered 2017-02-23: 10 mg via INTRAVENOUS
  Administered 2017-02-23: 5 mg via INTRAVENOUS
  Administered 2017-02-23 (×2): 10 mg via INTRAVENOUS
  Administered 2017-02-23: 5 mg via INTRAVENOUS
  Administered 2017-02-23: 10 mg via INTRAVENOUS

## 2017-02-23 MED ORDER — POVIDONE-IODINE 7.5 % EX SOLN
Freq: Once | CUTANEOUS | Status: DC
  Filled 2017-02-23: qty 118

## 2017-02-23 MED ORDER — PROPOFOL 10 MG/ML IV BOLUS
INTRAVENOUS | Status: AC
  Filled 2017-02-23: qty 40

## 2017-02-23 MED ORDER — MEPERIDINE HCL 50 MG/ML IJ SOLN: 6.2500 mg | INTRAMUSCULAR | Status: DC | PRN

## 2017-02-23 MED ORDER — CEFAZOLIN SODIUM-DEXTROSE 2-4 GM/100ML-% IV SOLN
INTRAVENOUS | Status: AC
  Filled 2017-02-23: qty 100

## 2017-02-23 MED ORDER — CEFAZOLIN SODIUM-DEXTROSE 2-4 GM/100ML-% IV SOLN
2.0000 g | INTRAVENOUS | Status: AC
  Administered 2017-02-23: 2 g via INTRAVENOUS

## 2017-02-23 MED ORDER — BUPIVACAINE HCL (PF) 0.25 % IJ SOLN
INTRAMUSCULAR | Status: DC | PRN
  Administered 2017-02-23: 20 mL

## 2017-02-23 MED ORDER — FENTANYL CITRATE (PF) 100 MCG/2ML IJ SOLN: 25.0000 ug | INTRAMUSCULAR | Status: DC | PRN

## 2017-02-23 MED ORDER — BUPIVACAINE LIPOSOME 1.3 % IJ SUSP
INTRAMUSCULAR | Status: AC
  Filled 2017-02-23: qty 20

## 2017-02-23 MED ORDER — OXYCODONE-ACETAMINOPHEN 5-325 MG PO TABS: 1.0000 | ORAL_TABLET | Freq: Four times a day (QID) | ORAL | 0 refills | Status: AC | PRN

## 2017-02-23 MED ORDER — ONDANSETRON HCL 4 MG/2ML IJ SOLN
INTRAMUSCULAR | Status: DC | PRN
  Administered 2017-02-23: 4 mg via INTRAVENOUS

## 2017-02-23 MED ORDER — FENTANYL CITRATE (PF) 100 MCG/2ML IJ SOLN
INTRAMUSCULAR | Status: DC | PRN
  Administered 2017-02-23 (×2): 25 ug via INTRAVENOUS
  Administered 2017-02-23: 50 ug via INTRAVENOUS

## 2017-02-23 MED ORDER — LIDOCAINE HCL (CARDIAC) 20 MG/ML IV SOLN
INTRAVENOUS | Status: DC | PRN
  Administered 2017-02-23: 100 mg via INTRAVENOUS

## 2017-02-23 MED ORDER — LACTATED RINGERS IV SOLN
INTRAVENOUS | Status: DC
  Administered 2017-02-23 (×3): via INTRAVENOUS

## 2017-02-23 MED ORDER — OXYCODONE HCL 5 MG/5ML PO SOLN: 5.0000 mg | Freq: Once | ORAL | Status: DC | PRN

## 2017-02-23 MED ORDER — FENTANYL CITRATE (PF) 100 MCG/2ML IJ SOLN
INTRAMUSCULAR | Status: AC
  Filled 2017-02-23: qty 2

## 2017-02-23 SURGICAL SUPPLY — 65 items
BANDAGE ELASTIC 4 LF NS (GAUZE/BANDAGES/DRESSINGS) ×2 IMPLANT
BANDAGE ELASTIC 4 VELCRO NS (GAUZE/BANDAGES/DRESSINGS) ×2 IMPLANT
BANDAGE STRETCH 3X4.1 STRL (GAUZE/BANDAGES/DRESSINGS) ×4 IMPLANT
BENZOIN TINCTURE PRP APPL 2/3 (GAUZE/BANDAGES/DRESSINGS) ×2 IMPLANT
BLADE MED AGGRESSIVE (BLADE) ×2 IMPLANT
BLADE OSC/SAGITTAL MD 5.5X18 (BLADE) ×2 IMPLANT
BLADE OSC/SAGITTAL MD 9X18.5 (BLADE) IMPLANT
BLADE OSCILLATING/SAGITTAL (BLADE)
BLADE SURG 15 STRL LF DISP TIS (BLADE) ×1 IMPLANT
BLADE SURG 15 STRL SS (BLADE) ×1
BLADE SURG MINI STRL (BLADE) ×4 IMPLANT
BLADE SW THK.38XMED LNG THN (BLADE) IMPLANT
BNDG COHESIVE 4X5 TAN STRL (GAUZE/BANDAGES/DRESSINGS) ×2 IMPLANT
BNDG ESMARK 4X12 TAN STRL LF (GAUZE/BANDAGES/DRESSINGS) ×2 IMPLANT
BNDG GAUZE 4.5X4.1 6PLY STRL (MISCELLANEOUS) ×2 IMPLANT
BNDG STRETCH 4X75 STRL LF (GAUZE/BANDAGES/DRESSINGS) ×2 IMPLANT
CANISTER SUCT 1200ML W/VALVE (MISCELLANEOUS) ×2 IMPLANT
COVER LIGHT HANDLE UNIVERSAL (MISCELLANEOUS) ×4 IMPLANT
CUFF DUAL TOURNIQUET 18IN DISP (TOURNIQUET CUFF) IMPLANT
DRAPE FLUOR MINI C-ARM 54X84 (DRAPES) ×2 IMPLANT
DURAPREP 26ML APPLICATOR (WOUND CARE) ×4 IMPLANT
ELECT REM PT RETURN 9FT ADLT (ELECTROSURGICAL) ×2
ELECTRODE REM PT RTRN 9FT ADLT (ELECTROSURGICAL) ×1 IMPLANT
GAUZE PETRO XEROFOAM 1X8 (MISCELLANEOUS) ×2 IMPLANT
GAUZE SPONGE 4X4 12PLY STRL (GAUZE/BANDAGES/DRESSINGS) ×2 IMPLANT
GLOVE BIO SURGEON STRL SZ7.5 (GLOVE) ×2 IMPLANT
GLOVE INDICATOR 8.0 STRL GRN (GLOVE) ×2 IMPLANT
GOWN STRL REUS W/ TWL LRG LVL3 (GOWN DISPOSABLE) ×2 IMPLANT
GOWN STRL REUS W/TWL LRG LVL3 (GOWN DISPOSABLE) ×2
K-WIRE DBL END TROCAR 6X.045 (WIRE)
K-WIRE DBL END TROCAR 6X.062 (WIRE)
KIT RM TURNOVER STRD PROC AR (KITS) ×2 IMPLANT
KIT ROOM TURNOVER OR (KITS) ×2 IMPLANT
KWIRE DBL END TROCAR 6X.045 (WIRE) IMPLANT
KWIRE DBL END TROCAR 6X.062 (WIRE) IMPLANT
LABEL OR SOLS (LABEL) ×2 IMPLANT
NEEDLE HYPO 25X1 1.5 SAFETY (NEEDLE) ×4 IMPLANT
NS IRRIG 500ML POUR BTL (IV SOLUTION) ×2 IMPLANT
PACK EXTREMITY ARMC (MISCELLANEOUS) ×2 IMPLANT
PENCIL ELECTRO HAND CTR (MISCELLANEOUS) ×2 IMPLANT
PIN BALLS 3/8 F/.045 WIRE (MISCELLANEOUS) ×2 IMPLANT
RASP SM TEAR CROSS CUT (RASP) ×2 IMPLANT
SPLINT CAST 1 STEP 5X30 WHT (MISCELLANEOUS) ×2 IMPLANT
STAPLE SPR MET 9X9X9 1.5 (Staple) ×2 IMPLANT
STOCKINETTE IMPERV 14X48 (MISCELLANEOUS) ×2 IMPLANT
STOCKINETTE STRL 6IN 960660 (GAUZE/BANDAGES/DRESSINGS) ×2 IMPLANT
STRAP SAFETY BODY (MISCELLANEOUS) ×2 IMPLANT
STRIP CLOSURE SKIN 1/4X4 (GAUZE/BANDAGES/DRESSINGS) ×2 IMPLANT
SUT ETHILON 4-0 (SUTURE) ×1
SUT ETHILON 4-0 FS2 18XMFL BLK (SUTURE) ×1
SUT ETHILON 5-0 FS-2 18 BLK (SUTURE) ×2 IMPLANT
SUT MNCRL 5-0+ PC-1 (SUTURE) IMPLANT
SUT MON AB 5-0 P3 18 (SUTURE) ×2 IMPLANT
SUT MONOCRYL 5-0 (SUTURE)
SUT VIC AB 2-0 SH 27 (SUTURE)
SUT VIC AB 2-0 SH 27XBRD (SUTURE) IMPLANT
SUT VIC AB 3-0 SH 27 (SUTURE) ×1
SUT VIC AB 3-0 SH 27X BRD (SUTURE) ×1 IMPLANT
SUT VIC AB 4-0 FS2 27 (SUTURE) ×2 IMPLANT
SUTURE ETHLN 4-0 FS2 18XMF BLK (SUTURE) ×1 IMPLANT
SWABSTK COMLB BENZOIN TINCTURE (MISCELLANEOUS) ×2 IMPLANT
SYR 10ML LL (SYRINGE) ×2 IMPLANT
WIRE Z .045 C-WIRE SPADE TIP (WIRE) ×2 IMPLANT
WIRE Z .062 C-WIRE SPADE TIP (WIRE) ×2 IMPLANT
c-wire .045 ×4 IMPLANT

## 2017-02-23 NOTE — Anesthesia Post-op Follow-up Note (Signed)
Anesthesia QCDR form completed.        

## 2017-02-23 NOTE — Transfer of Care (Signed)
Immediate Anesthesia Transfer of Care Note  Patient: Jimmy Hines  Procedure(s) Performed: HALLUX VALGUS DOUBLE OSTEOTOMY (Right ) HAMMER TOE CORRECTION-2ND & 3RD (Right )  Patient Location: PACU  Anesthesia Type:General  Level of Consciousness: awake  Airway & Oxygen Therapy: Patient Spontanous Breathing and Patient connected to nasal cannula oxygen  Post-op Assessment: Report given to RN and Post -op Vital signs reviewed and stable  Post vital signs: Reviewed and stable  Last Vitals:  Vitals:   02/23/17 1205 02/23/17 1524  BP: 106/68 117/67  Pulse: 64 72  Resp: 17 13  Temp: 36.4 C 37.1 C  SpO2: 95% 97%    Last Pain:  Vitals:   02/23/17 1205  TempSrc: Temporal  PainSc: 5       Patients Stated Pain Goal: 2 (98/02/21 7981)  Complications: No apparent anesthesia complications

## 2017-02-23 NOTE — Discharge Instructions (Addendum)
[facility] °[facility] ° °POST OPERATIVE INSTRUCTIONS FOR DR. TROXLER AND DR. FOWLER °[facility] ° ° °1. Take your medication as prescribed.  Pain medication should be taken only as needed. ° °2. Keep the dressing clean, dry and intact. ° °3. Keep your foot elevated above the heart level for the first 48 hours. ° °4. Walking to the bathroom and brief periods of walking are acceptable, unless we have instructed you to be non-weight bearing. ° °5. Always wear your post-op shoe when walking.  Always use your crutches if you are to be non-weight bearing. ° °6. Do not take a shower. Baths are permissible as long as the foot is kept out of the water.  ° °7. Every hour you are awake:  °- Bend your knee 15 times. °- Flex foot 15 times °- Massage calf 15 times ° °8. Call [facility] ([phone]) if any of the following problems occur: °- You develop a temperature or fever. °- The bandage becomes saturated with blood. °- Medication does not stop your pain. °- Injury of the foot occurs. °- Any symptoms of infection including redness, odor, or red streaks running from wound. ° ° ° ° °AMBULATORY SURGERY  °DISCHARGE INSTRUCTIONS ° ° °1) The drugs that you were given will stay in your system until tomorrow so for the next 24 hours you should not: ° °A) Drive an automobile °B) Make any legal decisions °C) Drink any alcoholic beverage ° ° °2) You may resume regular meals tomorrow.  Today it is better to start with liquids and gradually work up to solid foods. ° °You may eat anything you prefer, but it is better to start with liquids, then soup and crackers, and gradually work up to solid foods. ° ° °3) Please notify your doctor immediately if you have any unusual bleeding, trouble breathing, redness and pain at the surgery site, drainage, fever, or pain not relieved by medication. ° ° ° °4) Additional Instructions: ° ° ° ° ° ° ° °Please contact your physician with any  problems or Same Day Surgery at [phone], Monday through Friday 6 am to 4 pm, or [place] at [place] Main number at [phone]. ° °

## 2017-02-23 NOTE — Op Note (Signed)
Operative note   Surgeon:Karynn Deblasi Lawyer: None    Preop diagnosis: 1.  Hallux valgus right foot 2.  Hammertoe second toe DIPJ 3.  Hammertoe third toe DIPJ    Postop diagnosis: Same    Procedure: 1.  [place] with Akin double osteotomy right great toe and metatarsal 2.  DIPJ arthrodesis with K wire pinning right 2nd toe  3.  DIPJ arthrodesis with K wire pinning 3rd toe    HBZ:JIRCVEL    Anesthesia:local and general    Hemostasis: Ankle tourniquet inflated to 200 mmHg for 80 minutes    Specimen: None    Complications: None    Operative indications:Jimmy Hines is an 82 y.o. that presents today for surgical intervention.  The risks/benefits/alternatives/complications have been discussed and consent has been given.    Procedure:  Patient was brought into the OR and placed on the operating table in thesupine position. After anesthesia was obtained theright lower extremity was prepped and draped in usual sterile fashion.  Attention was directed to the dorsomedial right first MTPJ where a longitudinal incision was performed.  Sharp and blunt dissection carried down to the capsule.  A T capsulotomy was performed.  The dorsomedial eminence was noted and transected with a power saw.  Next a V osteotomy was created in the capital fragment translocated laterally.  Of note there were cystic changes within the metatarsal head.  The ensuing overhanging ledge was transected with a power saw.  This was then used for bone grafting into the cystic portion of the metatarsal head.  This was stabilized with a 0.062 K wire with good stability noted.  At this time mild residual valgus of the great toe was noted.  An Akin osteotomy through the proximal one third of the proximal phalanx was performed.  This was stabilized with a 9 x 9 x 1.5 compression staple.  Good compression and alignment was noted in all planes.  Wound closure was performed with a small capsulorrhaphy with a 3-0 Vicryl for the  capsule.  A 4-0 Vicryl was used for the subcutaneous tissue and a 5-0 Monocryl undyed for skin.  Attention was then directed to the second and third toes were longitudinal incisions were performed over the DIPJ's.  Sharp and blunt dissection carried down to the long extensor tendon.  Tenotomy was performed exposing the head of the middle phalanx and base of the distal phalanx.  The head of the middle phalanx and base of the distal phalanx were then removed of articular cartilage.  These were then stabilized with 0.045 K wires crossing the PIPJ and DIPJ.  Good stability and alignment was noted.  Closure was performed with a 4-0 Vicryl for the tendon and 4-0 nylon for skin.  Preoperatively all areas were infiltrated with a one-to-one mixture of 0.25% bupivacaine and Exparel long-acting anesthetic.  A total of 40 cc was used.    Patient tolerated the procedure and anesthesia well.  Was transported from the OR to the PACU with all vital signs stable and vascular status intact. To be discharged per routine protocol.  Will follow up in approximately 1 week in the outpatient clinic.

## 2017-02-23 NOTE — Anesthesia Preprocedure Evaluation (Signed)
Anesthesia Evaluation  Patient identified by MRN, date of birth, ID band Patient awake    Reviewed: Allergy & Precautions, NPO status , Patient's Chart, lab work & pertinent test results  History of Anesthesia Complications Negative for: history of anesthetic complications  Airway Mallampati: II  TM Distance: >3 FB Neck ROM: Full    Dental  (+) Upper Dentures, Lower Dentures   Pulmonary neg sleep apnea, neg COPD, former smoker,    breath sounds clear to auscultation- rhonchi (-) wheezing      Cardiovascular hypertension, Pt. on medications (-) CAD, (-) Past MI, (-) Cardiac Stents and (-) CABG  Rhythm:Regular Rate:Normal - Systolic murmurs and - Diastolic murmurs    Neuro/Psych negative neurological ROS  negative psych ROS   GI/Hepatic Neg liver ROS, GERD  ,  Endo/Other  negative endocrine ROSneg diabetes  Renal/GU negative Renal ROS     Musculoskeletal  (+) Arthritis ,   Abdominal (+) - obese,   Peds  Hematology negative hematology ROS (+)   Anesthesia Other Findings Past Medical History: No date: Arthritis No date: BPH (benign prostatic hypertrophy) No date: Dysrhythmia     Comment:  ireg hr, palpatations No date: Full dentures No date: GERD (gastroesophageal reflux disease)     Comment:  ulcers No date: HOH (hard of hearing) No date: Hypertension No date: Neuropathy of foot     Comment:  bilateral No date: Wears glasses     Comment:  reading   Reproductive/Obstetrics                             Anesthesia Physical Anesthesia Plan  ASA: II  Anesthesia Plan: General   Post-op Pain Management:    Induction: Intravenous  PONV Risk Score and Plan: 1 and Dexamethasone and Ondansetron  Airway Management Planned: LMA  Additional Equipment:   Intra-op Plan:   Post-operative Plan:   Informed Consent: I have reviewed the patients History and Physical, chart, labs and  discussed the procedure including the risks, benefits and alternatives for the proposed anesthesia with the patient or authorized representative who has indicated his/her understanding and acceptance.   Dental advisory given  Plan Discussed with: CRNA and Anesthesiologist  Anesthesia Plan Comments:         Anesthesia Quick Evaluation

## 2017-02-23 NOTE — H&P (Signed)
HISTORY AND PHYSICAL INTERVAL NOTE:  02/23/2017  12:45 PM  Jimmy Hines  has presented today for surgery, with the diagnosis of Bunion Hammertoes.  The various methods of treatment have been discussed with the patient.  No guarantees were given.  After consideration of risks, benefits and other options for treatment, the patient has consented to surgery.  I have reviewed the patients' chart and labs.    Patient Vitals for the past 24 hrs:  BP Temp Temp src Pulse Resp SpO2  02/23/17 1205 106/68 97.6 F (36.4 C) Temporal 64 17 95 %    A history and physical examination was performed in my office.  The patient was reexamined.  There have been no changes to this history and physical examination.  Jimmy Hines A

## 2017-02-26 ENCOUNTER — Encounter: Payer: Self-pay | Admitting: Podiatry

## 2017-02-26 NOTE — Anesthesia Postprocedure Evaluation (Signed)
Anesthesia Post Note  Patient: Jimmy Hines  Procedure(s) Performed: HALLUX VALGUS DOUBLE OSTEOTOMY (Right ) HAMMER TOE CORRECTION-2ND & 3RD (Right )  Patient location during evaluation: PACU Anesthesia Type: General Level of consciousness: awake and alert and oriented Pain management: pain level controlled Vital Signs Assessment: post-procedure vital signs reviewed and stable Respiratory status: spontaneous breathing, nonlabored ventilation and respiratory function stable Cardiovascular status: blood pressure returned to baseline and stable Postop Assessment: no signs of nausea or vomiting Anesthetic complications: no     Last Vitals:  Vitals:   02/23/17 1617 02/23/17 1641  BP: 129/71 (!) 141/82  Pulse: 64 69  Resp: 18 18  Temp: 36.7 C 36.7 C  SpO2: 97% 98%    Last Pain:  Vitals:   02/23/17 1641  TempSrc: Temporal  PainSc:                  [employer]

## 2017-02-26 NOTE — Addendum Note (Signed)
Addendum  created 02/26/17 1134 by Doreen Salvage, CRNA   Charge Capture section accepted

## 2017-06-26 ENCOUNTER — Other Ambulatory Visit: Payer: Self-pay | Admitting: Orthopedic Surgery

## 2017-06-26 DIAGNOSIS — M75100 Unspecified rotator cuff tear or rupture of unspecified shoulder, not specified as traumatic: Secondary | ICD-10-CM

## 2017-07-07 ENCOUNTER — Ambulatory Visit
Admission: RE | Admit: 2017-07-07 | Discharge: 2017-07-07 | Disposition: A | Discharge status: Home / Self Care | Payer: Medicare Other | Source: Ambulatory Visit | Attending: Orthopedic Surgery | Admitting: Orthopedic Surgery

## 2017-07-07 DIAGNOSIS — M25512 Pain in left shoulder: Secondary | ICD-10-CM | POA: Diagnosis present

## 2017-07-07 DIAGNOSIS — M75100 Unspecified rotator cuff tear or rupture of unspecified shoulder, not specified as traumatic: Secondary | ICD-10-CM

## 2017-07-07 DIAGNOSIS — M75102 Unspecified rotator cuff tear or rupture of left shoulder, not specified as traumatic: Secondary | ICD-10-CM | POA: Insufficient documentation

## 2017-07-07 DIAGNOSIS — C9000 Multiple myeloma not having achieved remission: Secondary | ICD-10-CM | POA: Insufficient documentation

## 2017-07-07 DIAGNOSIS — M19012 Primary osteoarthritis, left shoulder: Secondary | ICD-10-CM | POA: Diagnosis not present

## 2017-12-06 ENCOUNTER — Other Ambulatory Visit: Payer: Self-pay | Admitting: Internal Medicine

## 2017-12-06 DIAGNOSIS — D472 Monoclonal gammopathy: Secondary | ICD-10-CM

## 2017-12-06 DIAGNOSIS — M899 Disorder of bone, unspecified: Secondary | ICD-10-CM

## 2017-12-26 ENCOUNTER — Ambulatory Visit
Admission: RE | Admit: 2017-12-26 | Discharge: 2017-12-26 | Disposition: A | Discharge status: Home / Self Care | Payer: Medicare Other | Source: Ambulatory Visit | Attending: Internal Medicine | Admitting: Internal Medicine

## 2017-12-26 DIAGNOSIS — D472 Monoclonal gammopathy: Secondary | ICD-10-CM | POA: Diagnosis present

## 2017-12-26 DIAGNOSIS — M8448XA Pathological fracture, other site, initial encounter for fracture: Secondary | ICD-10-CM | POA: Insufficient documentation

## 2017-12-26 DIAGNOSIS — M899 Disorder of bone, unspecified: Secondary | ICD-10-CM | POA: Diagnosis not present

## 2017-12-26 DIAGNOSIS — M48061 Spinal stenosis, lumbar region without neurogenic claudication: Secondary | ICD-10-CM | POA: Insufficient documentation

## 2017-12-26 DIAGNOSIS — M47816 Spondylosis without myelopathy or radiculopathy, lumbar region: Secondary | ICD-10-CM | POA: Diagnosis not present

## 2017-12-26 DIAGNOSIS — M4802 Spinal stenosis, cervical region: Secondary | ICD-10-CM | POA: Insufficient documentation

## 2017-12-26 DIAGNOSIS — M47812 Spondylosis without myelopathy or radiculopathy, cervical region: Secondary | ICD-10-CM | POA: Diagnosis not present

## 2017-12-26 DIAGNOSIS — R29890 Loss of height: Secondary | ICD-10-CM | POA: Insufficient documentation

## 2017-12-26 MED ORDER — GADOBUTROL 1 MMOL/ML IV SOLN
9.0000 mL | Freq: Once | INTRAVENOUS | Status: AC | PRN
  Administered 2017-12-26: 9 mL via INTRAVENOUS

## 2018-03-18 ENCOUNTER — Other Ambulatory Visit: Payer: Self-pay | Admitting: Hematology and Oncology

## 2018-04-09 ENCOUNTER — Other Ambulatory Visit: Payer: Self-pay | Admitting: Internal Medicine

## 2018-04-09 DIAGNOSIS — R1084 Generalized abdominal pain: Secondary | ICD-10-CM

## 2018-04-15 ENCOUNTER — Encounter: Payer: Self-pay | Admitting: Emergency Medicine

## 2018-04-15 ENCOUNTER — Emergency Department: Payer: Medicare Other

## 2018-04-15 ENCOUNTER — Other Ambulatory Visit: Payer: Self-pay

## 2018-04-15 ENCOUNTER — Emergency Department
Admission: EM | Admit: 2018-04-15 | Discharge: 2018-04-15 | Disposition: A | Discharge status: Home / Self Care | Payer: Medicare Other | Attending: Emergency Medicine | Admitting: Emergency Medicine

## 2018-04-15 DIAGNOSIS — Z7982 Long term (current) use of aspirin: Secondary | ICD-10-CM | POA: Insufficient documentation

## 2018-04-15 DIAGNOSIS — M25512 Pain in left shoulder: Secondary | ICD-10-CM | POA: Insufficient documentation

## 2018-04-15 DIAGNOSIS — Z79899 Other long term (current) drug therapy: Secondary | ICD-10-CM | POA: Insufficient documentation

## 2018-04-15 DIAGNOSIS — Z87891 Personal history of nicotine dependence: Secondary | ICD-10-CM | POA: Diagnosis not present

## 2018-04-15 DIAGNOSIS — M5412 Radiculopathy, cervical region: Secondary | ICD-10-CM | POA: Diagnosis not present

## 2018-04-15 DIAGNOSIS — I10 Essential (primary) hypertension: Secondary | ICD-10-CM | POA: Diagnosis not present

## 2018-04-15 LAB — COMPREHENSIVE METABOLIC PANEL
ALBUMIN: 4.4 g/dL (ref 3.5–5.0)
ALT: 19 U/L (ref 0–44)
AST: 27 U/L (ref 15–41)
Alkaline Phosphatase: 61 U/L (ref 38–126)
Anion gap: 9 (ref 5–15)
BUN: 19 mg/dL (ref 8–23)
CHLORIDE: 102 mmol/L (ref 98–111)
CO2: 26 mmol/L (ref 22–32)
CREATININE: 1 mg/dL (ref 0.61–1.24)
Calcium: 9.4 mg/dL (ref 8.9–10.3)
GFR calc Af Amer: >60 mL/min (ref >60)
GFR calc non Af Amer: >60 mL/min (ref >60)
GLUCOSE: 114 mg/dL — AB (ref 70–99)
POTASSIUM: 4.6 mmol/L (ref 3.5–5.1)
Sodium: 137 mmol/L (ref 135–145)
Total Bilirubin: 1.1 mg/dL (ref 0.3–1.2)
Total Protein: 8.1 g/dL (ref 6.5–8.1)

## 2018-04-15 LAB — CBC WITH DIFFERENTIAL/PLATELET
ABS IMMATURE GRANULOCYTES: 0.01 10*3/uL (ref 0.00–0.07)
BASOS PCT: 1 %
Basophils Absolute: 0.1 10*3/uL (ref 0.0–0.1)
Eosinophils Absolute: 0.2 10*3/uL (ref 0.0–0.5)
Eosinophils Relative: 3 %
HEMATOCRIT: 40.3 % (ref 39.0–52.0)
HEMOGLOBIN: 13.8 g/dL (ref 13.0–17.0)
Immature Granulocytes: 0 %
LYMPHS ABS: 1 10*3/uL (ref 0.7–4.0)
LYMPHS PCT: 17 %
MCH: 34.2 pg — ABNORMAL HIGH (ref 26.0–34.0)
MCHC: 34.2 g/dL (ref 30.0–36.0)
MCV: 99.8 fL (ref 80.0–100.0)
MONO ABS: 0.6 10*3/uL (ref 0.1–1.0)
MONOS PCT: 11 %
NEUTROS ABS: 3.9 10*3/uL (ref 1.7–7.7)
NEUTROS PCT: 68 %
PLATELETS: 163 10*3/uL (ref 150–400)
RBC: 4.04 MIL/uL — ABNORMAL LOW (ref 4.22–5.81)
RDW: 13.9 % (ref 11.5–15.5)
WBC: 5.7 10*3/uL (ref 4.0–10.5)
nRBC: 0 % (ref 0.0–0.2)

## 2018-04-15 LAB — TROPONIN I: Troponin I: <0.03 ng/mL (ref <0.03)

## 2018-04-15 LAB — FIBRIN DERIVATIVES D-DIMER (ARMC ONLY): Fibrin derivatives D-dimer (ARMC): 732.6 ng/mL (FEU) — ABNORMAL HIGH (ref 0.00–499.00)

## 2018-04-15 MED ORDER — OXYCODONE-ACETAMINOPHEN 5-325 MG PO TABS: 1.0000 | ORAL_TABLET | ORAL | 0 refills | Status: AC | PRN

## 2018-04-15 MED ORDER — HYDROCODONE-ACETAMINOPHEN 5-325 MG PO TABS
1.0000 | ORAL_TABLET | Freq: Once | ORAL | Status: AC
  Administered 2018-04-15: 1 via ORAL
  Filled 2018-04-15: qty 1

## 2018-04-15 MED ORDER — MELOXICAM 15 MG PO TABS: 15.0000 mg | ORAL_TABLET | Freq: Every day | ORAL | 2 refills | Status: AC

## 2018-04-15 MED ORDER — IOHEXOL 350 MG/ML SOLN
75.0000 mL | Freq: Once | INTRAVENOUS | Status: AC | PRN
  Administered 2018-04-15: 75 mL via INTRAVENOUS
  Filled 2018-04-15: qty 75

## 2018-04-15 MED ORDER — BACLOFEN 10 MG PO TABS: 10.0000 mg | ORAL_TABLET | Freq: Three times a day (TID) | ORAL | 1 refills | Status: AC

## 2018-04-15 NOTE — Discharge Instructions (Addendum)
Follow-up with your regular doctor.  Drink plenty of water to help flush the IVP dye through your system.  Take the medications as prescribed.  Return if worsening.

## 2018-04-15 NOTE — ED Triage Notes (Signed)
Pt presents to ED c/o pain between shoulder blades that radiates down L arm. Pt has hx of neck surgery, states wearing soft collar and careful positioning or shoulder relieves pain somewhat. Pt states he came in because his MD recommended he get an EKG. Pt denies chest pain, sob, and nausea. No diaphoresis noted.

## 2018-04-15 NOTE — ED Provider Notes (Signed)
[facility] Emergency Department Provider Note  ____________________________________________   First MD Initiated Contact with Patient 04/15/18 1552     (approximate)  I have reviewed the triage vital signs and the nursing notes.   HISTORY  Chief Complaint Shoulder Pain    HPI Jimmy Hines is a 83 y.o. male presents emergency department complaining of left shoulder pain with pain that radiates into the shoulder blade and down the left arm.  He states he feels like it is due to his previous neck surgery.  States wearing the soft collar actually helps.  However when he called his regular doctor these instructed him to come to the ER to be worked up for a heart attack and blood clot.  He is currently being evaluated for multiple myeloma.    Past Medical History:  Diagnosis Date  . Arthritis   . BPH (benign prostatic hypertrophy)   . Dysrhythmia    ireg hr, palpatations  . Full dentures   . GERD (gastroesophageal reflux disease)    ulcers  . HOH (hard of hearing)   . Hypertension   . Neuropathy of foot    bilateral  . Wears glasses    reading    There are no active problems to display for this patient.   Past Surgical History:  Procedure Laterality Date  . ABDOMINAL HERNIA REPAIR  2000  . CATARACT EXTRACTION W/PHACO Left 12/18/2016   Procedure: CATARACT EXTRACTION PHACO AND INTRAOCULAR LENS PLACEMENT ([place]) LEFT;  Surgeon: Leandrew Koyanagi, MD;  Location: [place];  Service: Ophthalmology;  Laterality: Left;  . CATARACT EXTRACTION W/PHACO Right 02/01/2017   Procedure: CATARACT EXTRACTION PHACO AND INTRAOCULAR LENS PLACEMENT ([facility]);  Surgeon: Leandrew Koyanagi, MD;  Location: [facility];  Service: Ophthalmology;  Laterality: Right;  Lot # [id number] H VS :41.9 AP 14.7% CDE 6.15  . CERVICAL LAMINECTOMY  1982  . COLONOSCOPY    . EYE SURGERY Left    cataract extraction  . HALLUX VALGUS [place] Right 02/23/2017   Procedure: HALLUX  VALGUS DOUBLE OSTEOTOMY;  Surgeon: Samara Deist, DPM;  Location: [facility];  Service: Podiatry;  Laterality: Right;  . HAMMER TOE SURGERY Right 02/23/2017   Procedure: HAMMER TOE CORRECTION-2ND & 3RD;  Surgeon: Samara Deist, DPM;  Location: [facility];  Service: Podiatry;  Laterality: Right;  . HERNIA REPAIR  1995   rt ing   . [place]   right  . SHOULDER ARTHROSCOPY WITH ROTATOR CUFF REPAIR Right 01/06/2013   Procedure: RIGHT SHOULDER ARTHROSCOPY WITH ROTATOR CUFF REPAIR POSSIBLE DISTAL CLAVICAL EXCISION;  Surgeon: Jolyn Nap, MD;  Location: [place];  Service: Orthopedics;  Laterality: Right;  . THUMB ARTHROSCOPY  2011   right and left  . TONSILLECTOMY      Prior to Admission medications   Medication Sig Start Date End Date Taking? Authorizing Provider  acetaminophen (TYLENOL) 500 MG tablet Take 1,000 mg by mouth every 6 (six) hours as needed for moderate pain or headache.    [provider]  Ascorbic Acid (VITAMIN C PO) Take 500 mg by mouth at bedtime.     [provider]  aspirin 325 MG tablet Take 325 mg by mouth at bedtime.     [provider]  baclofen (LIORESAL) 10 MG tablet Take 1 tablet (10 mg total) by mouth 3 (three) times daily. 04/15/18 04/15/19  Fisher, Linden Dolin, PA-C  celecoxib (CELEBREX) 200 MG capsule Take 200 mg by mouth at bedtime.  04/12/15   [provider]  Cyanocobalamin (VITAMIN B-12 PO) Take 1,200 mcg by mouth at bedtime.     [provider]  gabapentin (NEURONTIN) 300 MG capsule Take 300 mg by mouth at bedtime.    [provider]  ibuprofen (ADVIL,MOTRIN) 200 MG tablet Take 200 mg by mouth every 6 (six) hours as needed.    [provider]  lisinopril-hydrochlorothiazide (PRINZIDE,ZESTORETIC) 20-12.5 MG per tablet Take 1 tablet by mouth at bedtime.     [provider]  meloxicam (MOBIC) 15 MG tablet Take 1 tablet (15 mg total) by mouth daily. 04/15/18 04/15/19   Fisher, Linden Dolin, PA-C  metoprolol succinate (TOPROL-XL) 100 MG 24 hr tablet Take 100 mg by mouth at bedtime. Take with or immediately following a meal.     [provider]  Multiple Vitamin (MULTIVITAMIN) capsule Take 1 capsule by mouth at bedtime.     [provider]  NONFORMULARY OR COMPOUNDED ITEM Peripheral neuropathy cream-[facility] 12/01/15   Hyatt, Max T, DPM  omeprazole (PRILOSEC) 20 MG capsule Take 20 mg by mouth at bedtime.     [provider]  oxyCODONE-acetaminophen (PERCOCET) 5-325 MG tablet Take 1-2 tablets by mouth every 6 (six) hours as needed for severe pain. 02/23/17   Samara Deist, DPM  oxyCODONE-acetaminophen (PERCOCET/ROXICET) 5-325 MG tablet Take 1 tablet by mouth every 4 (four) hours as needed for severe pain. 04/15/18   Fisher, Linden Dolin, PA-C  tamsulosin (FLOMAX) 0.4 MG CAPS capsule Take 0.4 mg by mouth at bedtime.     [provider]  vitamin E 400 UNIT capsule Take 400 Units by mouth at bedtime.     [provider]    Allergies Patient has no known allergies.  History reviewed. No pertinent family history.  Social History Social History   Tobacco Use  . Smoking status: Former Smoker    Last attempt to quit: 12/28/1982    Years since quitting: 35.3  . Smokeless tobacco: Never Used  Substance Use Topics  . Alcohol use: Yes    Comment: occ -Holidays  . Drug use: No    Review of Systems  Constitutional: No fever/chills Eyes: No visual changes. ENT: No sore throat. Respiratory: Denies cough Cardiovascular: Denies chest pain at this time, Genitourinary: Negative for dysuria. Musculoskeletal: Negative for back pain.  Positive for left shoulder pain Skin: Negative for rash.    ____________________________________________   PHYSICAL EXAM:  VITAL SIGNS: ED Triage Vitals  Enc Vitals Group     BP 04/15/18 1407 139/79     Pulse Rate 04/15/18 1407 (!) 55     Resp 04/15/18 1407 18     Temp 04/15/18  1407 97.8 F (36.6 C)     Temp Source 04/15/18 1407 Oral     SpO2 04/15/18 1407 96 %     Weight 04/15/18 1409 203 lb (92.1 kg)     Height 04/15/18 1409 '5\' 11"'$  (1.803 m)     Head Circumference --      Peak Flow --      Pain Score 04/15/18 1407 6     Pain Loc --      Pain Edu? --      Excl. in [place]? --     Constitutional: Alert and oriented. Well appearing and in no acute distress. Eyes: Conjunctivae are normal.  Head: Atraumatic. Nose: No congestion/rhinnorhea. Mouth/Throat: Mucous membranes are moist.   Neck:  supple no lymphadenopathy noted Cardiovascular: Normal rate, regular rhythm. Heart sounds are  normal Respiratory: Normal respiratory effort.  No retractions, lungs c t a  GU: deferred Musculoskeletal: Left shoulder is tender at the scapular levator, the bursa of the left scapula, and a large spasm is noted in the upper mid back.  Neurologic:  Normal speech and language.  Skin:  Skin is warm, dry and intact. No rash noted. Psychiatric: Mood and affect are normal. Speech and behavior are normal.  ____________________________________________   LABS (all labs ordered are listed, but only abnormal results are displayed)  Labs Reviewed  COMPREHENSIVE METABOLIC PANEL - Abnormal; Notable for the following components:      Result Value   Glucose, Bld 114 (*)    All other components within normal limits  CBC WITH DIFFERENTIAL/PLATELET - Abnormal; Notable for the following components:   RBC 4.04 (*)    MCH 34.2 (*)    All other components within normal limits  FIBRIN DERIVATIVES D-DIMER ([facility] ONLY) - Abnormal; Notable for the following components:   Fibrin derivatives D-dimer ([facility]) 732.60 (*)    All other components within normal limits  TROPONIN I   ____________________________________________   ____________________________________________  RADIOLOGY  CTA of the chest for PE is negative  ____________________________________________   PROCEDURES  Procedure(s)  performed: saline lock, vicodin 1 po   Procedures    ____________________________________________   INITIAL IMPRESSION / ASSESSMENT AND PLAN / ED COURSE  Pertinent labs & imaging results that were available during my care of the patient were reviewed by me and considered in my medical decision making (see chart for details).   Patient is an 83 year old male presents emergency department complaint of left shoulder pain that began earlier this morning.  Patient called his regular doctor and they wanted him to come the ER for evaluation of a heart attack and PE  Physical exam shows patient appears well.  Does have some spasms along the upper back and into the left scapula.  Remainder of exam is unremarkable  EKG shows sinus bradycardia with junctional ST depression   CBC is normal, troponin is negative, comprehensive metabolic panel is normal, d-dimer is elevated at 732  CTA of the chest for PE is normal  Explained all the findings to the patient.  Explained to him that I think this is strictly musculoskeletal due to the spasm noted and that the pain is relieved with positioning.  He was given a prescription for baclofen, meloxicam, and Percocet.  He is to follow-up with his regular doctor concerning the radiculopathy and upper back pain.  He is to return if he has any chest pain or increasing symptoms.  He states he understands will comply.  He was discharged in stable condition.  As part of my medical decision making, I reviewed the following data within the [facility] notes reviewed and incorporated, Labs reviewed CBC is normal, troponin is normal, comprehensive metabolic panel is normal, fibrin derivative/d-dimer is increased, EKG interpreted bradycardia, Old chart reviewed, Radiograph reviewed CTA of the chest for PE is normal, Notes from prior ED visits and [place] Controlled Substance Database  ____________________________________________   FINAL CLINICAL  IMPRESSION(S) / ED DIAGNOSES  Final diagnoses:  Acute pain of left shoulder  Cervical radiculopathy      NEW MEDICATIONS STARTED DURING THIS VISIT:  Discharge Medication List as of 04/15/2018  6:30 PM    START taking these medications   Details  baclofen (LIORESAL) 10 MG tablet Take 1 tablet (10 mg total) by mouth 3 (three) times daily., Starting Mon 04/15/2018,  Until Tue 04/15/2019, Normal    meloxicam (MOBIC) 15 MG tablet Take 1 tablet (15 mg total) by mouth daily., Starting Mon 04/15/2018, Until Tue 04/15/2019, Normal    !! oxyCODONE-acetaminophen (PERCOCET/ROXICET) 5-325 MG tablet Take 1 tablet by mouth every 4 (four) hours as needed for severe pain., Starting Mon 04/15/2018, Normal     !! - Potential duplicate medications found. Please discuss with provider.       Note:  This document was prepared using Dragon voice recognition software and may include unintentional dictation errors.    Versie Starks, PA-C 04/15/18 Freddie Breech, MD 04/16/18 [phone]

## 2018-04-17 ENCOUNTER — Ambulatory Visit
Admission: RE | Admit: 2018-04-17 | Discharge: 2018-04-17 | Disposition: A | Discharge status: Home / Self Care | Payer: Medicare Other | Source: Ambulatory Visit | Attending: Internal Medicine | Admitting: Internal Medicine

## 2018-04-17 ENCOUNTER — Other Ambulatory Visit: Payer: Self-pay

## 2018-04-17 DIAGNOSIS — R1084 Generalized abdominal pain: Secondary | ICD-10-CM | POA: Insufficient documentation

## 2018-04-17 MED ORDER — IOHEXOL 300 MG/ML  SOLN
100.0000 mL | Freq: Once | INTRAMUSCULAR | Status: AC | PRN
  Administered 2018-04-17: 100 mL via INTRAVENOUS

## 2019-02-04 ENCOUNTER — Ambulatory Visit
Admission: RE | Admit: 2019-02-04 | Discharge: 2019-02-04 | Disposition: A | Discharge status: Home / Self Care | Payer: Medicare Other | Source: Ambulatory Visit | Attending: Neurosurgery | Admitting: Neurosurgery

## 2019-02-04 ENCOUNTER — Other Ambulatory Visit: Payer: Self-pay | Admitting: Neurosurgery

## 2019-02-04 DIAGNOSIS — C9000 Multiple myeloma not having achieved remission: Secondary | ICD-10-CM

## 2019-02-04 DIAGNOSIS — E859 Amyloidosis, unspecified: Secondary | ICD-10-CM | POA: Insufficient documentation

## 2019-04-30 ENCOUNTER — Other Ambulatory Visit: Payer: Self-pay | Admitting: Anesthesiology

## 2019-04-30 DIAGNOSIS — G8929 Other chronic pain: Secondary | ICD-10-CM

## 2019-04-30 DIAGNOSIS — M5442 Lumbago with sciatica, left side: Secondary | ICD-10-CM

## 2019-05-12 ENCOUNTER — Ambulatory Visit
Admission: RE | Admit: 2019-05-12 | Discharge: 2019-05-12 | Disposition: A | Discharge status: Home / Self Care | Payer: Medicare Other | Source: Ambulatory Visit | Attending: Anesthesiology | Admitting: Anesthesiology

## 2019-05-12 ENCOUNTER — Other Ambulatory Visit: Payer: Self-pay

## 2019-05-12 DIAGNOSIS — G8929 Other chronic pain: Secondary | ICD-10-CM | POA: Diagnosis present

## 2019-05-12 DIAGNOSIS — M5442 Lumbago with sciatica, left side: Secondary | ICD-10-CM | POA: Insufficient documentation

## 2020-02-12 ENCOUNTER — Other Ambulatory Visit (HOSPITAL_COMMUNITY): Payer: Self-pay | Admitting: Internal Medicine

## 2020-02-12 ENCOUNTER — Other Ambulatory Visit: Payer: Self-pay | Admitting: Internal Medicine

## 2020-02-12 DIAGNOSIS — R1013 Epigastric pain: Secondary | ICD-10-CM

## 2020-02-12 DIAGNOSIS — R1313 Dysphagia, pharyngeal phase: Secondary | ICD-10-CM

## 2020-02-16 ENCOUNTER — Other Ambulatory Visit: Payer: Self-pay

## 2020-02-16 ENCOUNTER — Ambulatory Visit
Admission: RE | Admit: 2020-02-16 | Discharge: 2020-02-16 | Disposition: A | Discharge status: Home / Self Care | Payer: Medicare Other | Source: Ambulatory Visit | Attending: Internal Medicine | Admitting: Internal Medicine

## 2020-02-16 DIAGNOSIS — R1313 Dysphagia, pharyngeal phase: Secondary | ICD-10-CM | POA: Diagnosis present

## 2020-02-16 DIAGNOSIS — R1013 Epigastric pain: Secondary | ICD-10-CM | POA: Insufficient documentation

## 2021-03-21 ENCOUNTER — Other Ambulatory Visit: Payer: Self-pay

## 2021-03-21 ENCOUNTER — Emergency Department
Admission: EM | Admit: 2021-03-21 | Discharge: 2021-03-22 | Discharge status: Against Medical Advice | Payer: Medicare Other | Attending: Emergency Medicine | Admitting: Emergency Medicine

## 2021-03-21 ENCOUNTER — Emergency Department: Payer: Medicare Other

## 2021-03-21 DIAGNOSIS — L723 Sebaceous cyst: Secondary | ICD-10-CM

## 2021-03-21 DIAGNOSIS — L089 Local infection of the skin and subcutaneous tissue, unspecified: Secondary | ICD-10-CM | POA: Insufficient documentation

## 2021-03-21 DIAGNOSIS — M545 Low back pain, unspecified: Secondary | ICD-10-CM | POA: Insufficient documentation

## 2021-03-21 DIAGNOSIS — R202 Paresthesia of skin: Secondary | ICD-10-CM | POA: Diagnosis present

## 2021-03-21 NOTE — ED Provider Notes (Signed)
[facility] Provider Note    Event Date/Time   First MD Initiated Contact with Patient 03/21/21 2104     (approximate)   History   Back Pain   HPI  Jimmy Hines is a 86 y.o. male with history of multiple myeloma.  He reports increasing numbness and tingling in his legs and more frequent falls.  He thinks possibly he has something going on with the multiple myeloma in his back.  He called his primary care provider was told to go to the emergency department.  He is usually seen at [facility] but came here because it is closer.      Physical Exam   Triage Vital Signs: ED Triage Vitals  Enc Vitals Group     BP 03/21/21 1738 138/83     Pulse Rate 03/21/21 1738 65     Resp 03/21/21 1738 18     Temp 03/21/21 1738 98.3 F (36.8 C)     Temp Source 03/21/21 1738 Oral     SpO2 03/21/21 1738 94 %     Weight 03/21/21 1739 200 lb 9.9 oz (91 kg)     Height 03/21/21 1739 _0  (1.803 m)     Head Circumference --      Peak Flow --      Pain Score 03/21/21 1738 0     Pain Loc --      Pain Edu? --      Excl. in [place]? --     Most recent vital signs: Vitals:   03/21/21 2230 03/22/21 0004  BP: 128/88 128/88  Pulse: 63 63  Resp: 17 19  Temp:    SpO2: 95% 98%    General: Awake, no distress.  CV:  Good peripheral perfusion.  Heart regular rate and rhythm no audible murmur Resp:  Normal effort.  Lungs are clear Abd:  No distention.  Soft and nontender Back: Patient reports he had a large pimple on his back that had been there for a long time.  It sounds like it was possibly sebaceous cyst.  Patient popped it yesterday.  It is now draining.  Area was compressed at about 5 cc of pus the sebaceous material was expressed.  Wound was then cleaned and packed with iodoform gauze. Extremities: Left leg is somewhat weaker than the right.  This is chronic.  Hip flexors are the weakest.  He reports numbness and tingling from the knee down this is chronic but possibly  slightly worse.  He also has numbness and tingling in the right foot also chronic but possibly slightly worse.  Patient has no DTRs knees or ankles.   ED Results / Procedures / Treatments   Labs (all labs ordered are listed, but only abnormal results are displayed) Labs Reviewed - No data to display   EKG     RADIOLOGY    PROCEDURES:  Critical Care performed:   Procedures see above in the note for description of what I did with the patient's using back wound likely sebaceous cyst that was infected.   MEDICATIONS ORDERED IN ED: Medications - No data to display   IMPRESSION / MDM / [place] / ED COURSE  I reviewed the triage vital signs and the nursing notes. Patient will return tomorrow for recheck of the back and possible repacking.  I have given him the packing material.  Patient's history is somewhat worrisome for progression of multiple myeloma possibly with spinal cord impingement.  I have ordered  an MRI. ----------------------------------------- 12:40 AM on 03/22/2021 ----------------------------------------- Patient walking normally his symptoms are slowly progressive.  He had the MRI but then decided he could not wait for the results.  He is supposed to come back tomorrow the next day for recheck of his back and repacking of his back wound.  He understands the risk of progression of this problem and loss of use of his legs.  The MRI report is still not back.  We will be able to check it tomorrow or the next day when he does come back unless of course he does not.  He is aware that he should come back and does know the risks as I mentioned.  He did sign out AMA. I will sign out this to the oncoming physician so that they can check.     FINAL CLINICAL IMPRESSION(S) / ED DIAGNOSES   Final diagnoses:  Low back pain, unspecified back pain laterality, unspecified chronicity, unspecified whether sciatica present  Infected sebaceous cyst of skin     Rx /  DC Orders   ED Discharge Orders     None        Note:  This document was prepared using Dragon voice recognition software and may include unintentional dictation errors.   Nena Polio, MD 03/22/21 [phone]

## 2021-03-21 NOTE — ED Triage Notes (Addendum)
Patient to the ER via POV with complaints of lower back pain, reports the pain is intermittent in his lower back and hips. Describes it as nerve pain. States he is currently being treated for multiple myeloma and thinks he may have new areas present to his lower back. Denies falls/ injury.

## 2021-03-22 NOTE — ED Notes (Signed)
Pt requesting to leave and go home. Pt encouraged to stay for MRI but refusing. Cinda Quest, MD spoke with pt due to [facility] with instructions on follow up care. Signed AMA form completed by pt post explanation. Pt ambulatory with steady gait to [place] for self departure.

## 2021-03-23 ENCOUNTER — Other Ambulatory Visit: Payer: Self-pay

## 2021-03-23 ENCOUNTER — Encounter: Payer: Self-pay | Admitting: Emergency Medicine

## 2021-03-23 ENCOUNTER — Emergency Department
Admission: EM | Admit: 2021-03-23 | Discharge: 2021-03-23 | Disposition: A | Discharge status: Home / Self Care | Payer: Medicare Other | Attending: Student in an Organized Health Care Education/Training Program | Admitting: Student in an Organized Health Care Education/Training Program

## 2021-03-23 DIAGNOSIS — Z4801 Encounter for change or removal of surgical wound dressing: Secondary | ICD-10-CM | POA: Diagnosis not present

## 2021-03-23 DIAGNOSIS — Z5189 Encounter for other specified aftercare: Secondary | ICD-10-CM

## 2021-03-23 DIAGNOSIS — Z8579 Personal history of other malignant neoplasms of lymphoid, hematopoietic and related tissues: Secondary | ICD-10-CM | POA: Diagnosis not present

## 2021-03-23 NOTE — ED Triage Notes (Signed)
Pt comes into the ED via POV c/o need for wound recheck.  Pt seen yesterday where he had a boil lanced, and he was instructed to come back today to have it rechecked.  Pt in NAD.

## 2021-03-23 NOTE — ED Provider Notes (Signed)
° °  [facility] °Provider Note ° ° ° Event Date/Time  ° First MD Initiated Contact with Patient 03/23/21 1458   °  (approximate) ° ° °History  ° °Wound Check ° ° °HPI ° °Jimmy Hines is a 85 y.o. male with history of multiple myeloma and as listed in EMR presents to the emergency department for wound check after I&D 2 days ago. His son has been changing the dressing and has repacked it once. Area feels less sore than prior to procedure. No new symptoms of concern.. ° °  ° ° °Physical Exam  ° °Triage Vital Signs: °ED Triage Vitals  °Enc Vitals Group  °   BP 03/23/21 1426 121/70  °   Pulse Rate 03/23/21 1426 61  °   Resp 03/23/21 1426 14  °   Temp 03/23/21 1426 98.1 °F (36.7 °C)  °   Temp src --   °   SpO2 03/23/21 1426 95 %  °   Weight 03/23/21 1421 200 lb 9.9 oz (91 kg)  °   Height 03/23/21 1421 5' 11" (1.803 m)  °   Head Circumference --   °   Peak Flow --   °   Pain Score 03/23/21 1421 0  °   Pain Loc --   °   Pain Edu? --   °   Excl. in [place]? --   ° ° °Most recent vital signs: °Vitals:  ° 03/23/21 1426  °BP: 121/70  °Pulse: 61  °Resp: 14  °Temp: 98.1 °F (36.7 °C)  °SpO2: 95%  ° ° °General: Awake, no distress.  °CV:  Good peripheral perfusion.  °Resp:  Normal effort.  °Abd:  No distention.  °Other:  Site of I&D on mid back with mild erythema and induration. No purulent drainage noted. ° ° °ED Results / Procedures / Treatments  ° °Labs °(all labs ordered are listed, but only abnormal results are displayed) °Labs Reviewed - No data to display ° ° °EKG ° ° ° ° °RADIOLOGY ° °Image and radiology report reviewed by me. ° ° ° °PROCEDURES: ° °Critical Care performed: No ° °Wound packing ° °Date/Time: 03/23/2021 4:03 PM °Performed by: Triplett, Cari B, FNP °Authorized by: Triplett, Cari B, FNP  °Consent: Verbal consent obtained. °Consent given by: patient °Patient identity confirmed: verbally with patient °Local anesthesia used: no ° °Anesthesia: °Local anesthesia used: no °Patient tolerance:  patient tolerated the procedure well with no immediate complications ° ° ° ° °MEDICATIONS ORDERED IN ED: °Medications - No data to display ° ° °IMPRESSION / MDM / ASSESSMENT AND PLAN / ED COURSE  ° °I have reviewed the triage note. ° °Differential diagnosis includes, but is not limited to, abscess, cellulitis ° °85-year-old male presenting to the emergency department for treatment and evaluation after I&D 2 days ago. Able to express some thick purulent drainage. Wound was repacked as above. Instructions to repack every 2 days discussed with the patient who was also advised to see primary care next week for wound check. °  ° ° °FINAL CLINICAL IMPRESSION(S) / ED DIAGNOSES  ° °Final diagnoses:  °Wound check, abscess  ° ° ° °Rx / DC Orders  ° °ED Discharge Orders   ° ° None  ° °  ° ° ° °Note:  This document was prepared using Dragon voice recognition software and may include unintentional dictation errors. °  °Triplett, Cari B, FNP °03/23/21 1604 ° °  °Kinner, Robert, MD °03/23/21 1606 ° °

## 2021-03-23 NOTE — Discharge Instructions (Signed)
Change packing every 2 days. Follow up with primary care in a week for wound check.  Return to the ER for symptoms that change, worsen, or for new concerns.

## 2021-03-30 ENCOUNTER — Emergency Department
Admission: EM | Admit: 2021-03-30 | Discharge: 2021-03-30 | Disposition: A | Discharge status: Home / Self Care | Payer: Medicare Other | Attending: Emergency Medicine | Admitting: Emergency Medicine

## 2021-03-30 ENCOUNTER — Encounter: Payer: Self-pay | Admitting: Emergency Medicine

## 2021-03-30 ENCOUNTER — Other Ambulatory Visit: Payer: Self-pay

## 2021-03-30 DIAGNOSIS — L723 Sebaceous cyst: Secondary | ICD-10-CM | POA: Insufficient documentation

## 2021-03-30 NOTE — Discharge Instructions (Signed)
Keep the dressing that was placed today on until tomorrow.  You can then switch it to Band-Aids.  Keep the area clean and dry.  The boil on your back is a sebaceous cyst.  If it reoccurs it may need to be removed.  You can follow-up with a dermatologist about this.  In the meantime, return to the ER for new or worsening pain, swelling, persistent or worsening pus drainage, bleeding, redness or swelling spreading around the cyst, fever, or any other new or worsening symptoms that concern you.

## 2021-03-30 NOTE — ED Provider Notes (Signed)
[facility] Provider Note    Event Date/Time   First MD Initiated Contact with Patient 03/30/21 1839     (approximate)   History   Wound Check   HPI  Jimmy Hines is a 86 y.o. male with a history of mild multiple myeloma who presents for reevaluation of a drained abscess on his back.  It was initially drained on 2/13 and packed.  The patient states in the last few days there has been a small amount of white drainage but no bleeding.  There has been no swelling or redness around the wound and no fever or chills.    Physical Exam   Triage Vital Signs: ED Triage Vitals  Enc Vitals Group     BP 03/30/21 1736 (!) 147/66     Pulse Rate 03/30/21 1736 64     Resp 03/30/21 1736 19     Temp 03/30/21 1736 98.4 F (36.9 C)     Temp Source 03/30/21 1736 Oral     SpO2 03/30/21 1736 95 %     Weight 03/30/21 1734 200 lb 9.9 oz (91 kg)     Height 03/30/21 1734 _0  (1.803 m)     Head Circumference --      Peak Flow --      Pain Score --      Pain Loc --      Pain Edu? --      Excl. in [place]? --     Most recent vital signs: Vitals:   03/30/21 1736  BP: (!) 147/66  Pulse: 64  Resp: 19  Temp: 98.4 F (36.9 C)  SpO2: 95%     General: Awake, no distress.  CV:  Good peripheral perfusion.  Resp:  Normal effort.  Abd:  No distention.  Other:  Central upper back with approximately 5 mm open wound with a small amount of packing in place.  When compressed, sebaceous type discharge is expressed but no pus or blood.  There is no surrounding erythema, induration, or abnormal warmth.   ED Results / Procedures / Treatments   Labs (all labs ordered are listed, but only abnormal results are displayed) Labs Reviewed - No data to display   EKG     RADIOLOGY   PROCEDURES:  Critical Care performed: No  Procedures   MEDICATIONS ORDERED IN ED: Medications - No data to display   IMPRESSION / MDM / [place] / ED COURSE  I reviewed  the triage vital signs and the nursing notes.  86 year old male with PMH as noted above presents for wound check for a upper back abscess or boil that he had I&D about 9 days ago.  He is having minimal pain or other symptoms.  On exam the patient is well-appearing and his vital signs are normal.  There is a small wound with no evidence of surrounding cellulitis.  I am able to express a small amount of white discharge which is not purulent; this is consistent with a sebaceous cyst.  At this time, he does not need further packing.  I have left the wound open just with a sterile dressing.  I counseled him that this is a sebaceous cyst and that it may continue to have some discharge but should heal on its own.  However, it can reaccumulate and if it does, he can see a dermatologist for cyst excision.  At this time, he is stable for discharge home.  Return precautions given, the patient  expresses understanding.   FINAL CLINICAL IMPRESSION(S) / ED DIAGNOSES   Final diagnoses:  Sebaceous cyst     Rx / DC Orders   ED Discharge Orders     None        Note:  This document was prepared using Dragon voice recognition software and may include unintentional dictation errors.    Arta Silence, MD 03/30/21 [phone]

## 2021-03-30 NOTE — ED Triage Notes (Signed)
Here for wound check.  Seen 03/22/2021 and had a boil lanced.  Instructed to return for wound check.  Denies complaint

## 2021-12-12 ENCOUNTER — Other Ambulatory Visit: Payer: Self-pay

## 2021-12-12 ENCOUNTER — Emergency Department: Payer: Medicare Other

## 2021-12-12 ENCOUNTER — Emergency Department
Admission: EM | Admit: 2021-12-12 | Discharge: 2021-12-12 | Disposition: A | Discharge status: Home / Self Care | Payer: Medicare Other | Attending: Emergency Medicine | Admitting: Emergency Medicine

## 2021-12-12 DIAGNOSIS — M79605 Pain in left leg: Secondary | ICD-10-CM | POA: Insufficient documentation

## 2021-12-12 DIAGNOSIS — L539 Erythematous condition, unspecified: Secondary | ICD-10-CM | POA: Diagnosis not present

## 2021-12-12 DIAGNOSIS — I10 Essential (primary) hypertension: Secondary | ICD-10-CM | POA: Insufficient documentation

## 2021-12-12 DIAGNOSIS — R944 Abnormal results of kidney function studies: Secondary | ICD-10-CM | POA: Diagnosis not present

## 2021-12-12 DIAGNOSIS — M7989 Other specified soft tissue disorders: Secondary | ICD-10-CM | POA: Diagnosis not present

## 2021-12-12 LAB — CBC
HCT: 35.7 % — ABNORMAL LOW (ref 39.0–52.0)
Hemoglobin: 11.9 g/dL — ABNORMAL LOW (ref 13.0–17.0)
MCH: 32.8 pg (ref 26.0–34.0)
MCHC: 33.3 g/dL (ref 30.0–36.0)
MCV: 98.3 fL (ref 80.0–100.0)
Platelets: 275 10*3/uL (ref 150–400)
RBC: 3.63 MIL/uL — ABNORMAL LOW (ref 4.22–5.81)
RDW: 15.8 % — ABNORMAL HIGH (ref 11.5–15.5)
WBC: 5.6 10*3/uL (ref 4.0–10.5)
nRBC: 0 % (ref 0.0–0.2)

## 2021-12-12 LAB — COMPREHENSIVE METABOLIC PANEL
ALT: 23 U/L (ref 0–44)
AST: 31 U/L (ref 15–41)
Albumin: 3.9 g/dL (ref 3.5–5.0)
Alkaline Phosphatase: 83 U/L (ref 38–126)
Anion gap: 7 (ref 5–15)
BUN: 19 mg/dL (ref 8–23)
CO2: 30 mmol/L (ref 22–32)
Calcium: 9.6 mg/dL (ref 8.9–10.3)
Chloride: 103 mmol/L (ref 98–111)
Creatinine, Ser: 1.3 mg/dL — ABNORMAL HIGH (ref 0.61–1.24)
GFR, Estimated: 54 mL/min — ABNORMAL LOW (ref >60)
Glucose, Bld: 101 mg/dL — ABNORMAL HIGH (ref 70–99)
Potassium: 4 mmol/L (ref 3.5–5.1)
Sodium: 140 mmol/L (ref 135–145)
Total Bilirubin: 0.7 mg/dL (ref 0.3–1.2)
Total Protein: 7.5 g/dL (ref 6.5–8.1)

## 2021-12-12 NOTE — ED Provider Triage Note (Signed)
  Emergency Medicine Provider Triage Evaluation Note  Jimmy Hines , a 86 y.o.male,  was evaluated in triage.  Pt complains of left leg pain x1 month.  Patient states that he has had intermittent swelling in that leg.  He is currently on Eliquis.  He was advised by his primary care provider to come to the emergency department to rule out DVT.  Denies any other symptoms at this time.   Review of Systems  Positive: Left leg swelling Negative: Denies fever, chest pain, vomiting  Physical Exam   Vitals:   12/12/21 1518  BP: (!) 147/107  Pulse: 79  Resp: 18  Temp: 97.9 F (36.6 C)  SpO2: 98%   Gen:   Awake, no distress   Resp:  Normal effort  MSK:   Moves extremities without difficulty  Other:    Medical Decision Making  Given the patient's initial medical screening exam, the following diagnostic evaluation has been ordered. The patient will be placed in the appropriate treatment space, once one is available, to complete the evaluation and treatment. I have discussed the plan of care with the patient and I have advised the patient that an ED physician or mid-level practitioner will reevaluate their condition after the test results have been received, as the results may give them additional insight into the type of treatment they may need.    Diagnostics: Labs, DVT ultrasound  Treatments: none immediately   Teodoro Spray, [place] 12/12/21 1558

## 2021-12-12 NOTE — ED Triage Notes (Signed)
Pt to ED via POV from home. Pt reports left leg pain x1 month. Pt also reports intermittent swelling in leg. Pt denies SOB or CP. Pt on Eliquis. Pt advised by PCP to come to ED for DVT rule out.

## 2021-12-12 NOTE — Discharge Instructions (Addendum)
-  Your ultrasound did not show any signs of DVT or blood clots, however it did showe that you had a cystic lesion in your left leg, which is most consistent with a Baker's cyst.  These are typically benign and will go away on their own.  Please review the educational material provided.  Follow-up with your primary care provider or the orthopedist listed in these instructions as needed.  -Your labs did show that you are slightly dehydrated.  Be sure that you are drinking plenty of fluids throughout the day.  -You may take Tylenol as needed for pain.  -To the emergency department anytime if you begin to experience any new or worsening symptoms.

## 2021-12-12 NOTE — ED Provider Notes (Signed)
[facility] Provider Note    Event Date/Time   First MD Initiated Contact with Patient 12/12/21 1638     (approximate)   History   Chief Complaint Leg Pain   HPI Jimmy Hines is a 86 y.o. male, history of hypertension, BPH, arthritis, GERD, dysrhythmia, presents to the emergency department for evaluation of leg pain.  Patient states that he has been having ongoing pain in his leg for the past month with intermittent episodes of swelling.  Patient states that the pain is mostly along the lateral aspect of his lower left extremity hurts most whenever he is walking on it.  However, he states that for the most part does not really bother him.  He was advised by his primary care provider to come here to have a ultrasound to rule out DVT.  Denies fever/chills, chest pain, shortness of breath, abdominal pain, nausea/vomiting, diarrhea, dysuria, vision changes, headache, weakness, rash/lesions, or dizziness/lightheadedness.  History Limitations: No limitations.        Physical Exam  Triage Vital Signs: ED Triage Vitals  Enc Vitals Group     BP 12/12/21 1518 (!) 147/107     Pulse Rate 12/12/21 1518 79     Resp 12/12/21 1518 18     Temp 12/12/21 1518 97.9 F (36.6 C)     Temp Source 12/12/21 1518 Oral     SpO2 12/12/21 1518 98 %     Weight --      Height --      Head Circumference --      Peak Flow --      Pain Score 12/12/21 1519 3     Pain Loc --      Pain Edu? --      Excl. in [place]? --     Most recent vital signs: Vitals:   12/12/21 1518  BP: (!) 147/107  Pulse: 79  Resp: 18  Temp: 97.9 F (36.6 C)  SpO2: 98%    General: Awake, NAD.  Skin: Warm, dry. No rashes or lesions.  Eyes: PERRL. Conjunctivae normal.  CV: Good peripheral perfusion.  Resp: Normal effort.  Abd: Soft, non-tender. No distention.  Neuro: At baseline. No gross neurological deficits.  Musculoskeletal: Normal ROM of all extremities.  Focused Exam: Mild swelling  appreciated in the left lower extremity, mostly below the mid calf.  Erythema appreciated bilaterally below both knees.  He states that this is usual for him.  PMS intact distally.  He is still able to ambulate well on his own without difficulty.  Physical Exam    ED Results / Procedures / Treatments  Labs (all labs ordered are listed, but only abnormal results are displayed) Labs Reviewed  CBC - Abnormal; Notable for the following components:      Result Value   RBC 3.63 (*)    Hemoglobin 11.9 (*)    HCT 35.7 (*)    RDW 15.8 (*)    All other components within normal limits  COMPREHENSIVE METABOLIC PANEL - Abnormal; Notable for the following components:   Glucose, Bld 101 (*)    Creatinine, Ser 1.30 (*)    GFR, Estimated 54 (*)    All other components within normal limits     EKG N/A.    RADIOLOGY  ED Provider Interpretation: I personally reviewed and interpreted this ultrasound, no evidence of DVT.  US Venous Img Lower Unilateral Left  Result Date: 12/12/2021 CLINICAL DATA:  Leg pain and edema.  History of  prior DVT. EXAM: LEFT LOWER EXTREMITY VENOUS DOPPLER ULTRASOUND TECHNIQUE: Gray-scale sonography with compression, as well as color and duplex ultrasound, were performed to evaluate the deep venous system(s) from the level of the common femoral vein through the popliteal and proximal calf veins. COMPARISON:  None Available. FINDINGS: VENOUS Normal compressibility of the common femoral, superficial femoral, and popliteal veins, as well as the visualized calf veins. Visualized portions of profunda femoral vein and great saphenous vein unremarkable. No filling defects to suggest DVT on grayscale or color Doppler imaging. Doppler waveforms show normal direction of venous flow, normal respiratory plasticity and response to augmentation. Limited views of the contralateral common femoral vein are unremarkable. OTHER Cystic lesion along the popliteal fossa, probably a Baker's cyst, 3.6  by 1.2 by 1.7 cm. Limitations: none IMPRESSION: 1. No evidence of left lower extremity DVT. 2. Probable Baker's cyst. Electronically Signed   By: Van Clines M.D.   On: 12/12/2021 16:59    PROCEDURES:  Critical Care performed: N/A.  Procedures    MEDICATIONS ORDERED IN ED: Medications - No data to display   IMPRESSION / MDM / [place] / ED COURSE  I reviewed the triage vital signs and the nursing notes.                              Differential diagnosis includes, but is not limited to, DVT, gastrocnemius strain, Baker's cyst, venous insufficiency.   ED Course Patient appears well, vitals within normal limits.  NAD.  CBC shows no leukocytosis.  Anemia present at 11.9, otherwise no significant findings.  No thrombocytopenia.  CMP shows mildly elevated creatinine at 1.3, otherwise no transaminitis or electrolyte abnormalities.  Assessment/Plan Patient presents with pain in the left lower extremity x1 month with intermittent swelling.  He appears well clinically.  He states that his leg does not bother him much.  His labs show a mildly elevated creatinine, otherwise no significant lab abnormalities.  Ultrasound does not show any evidence of DVT, but does show a cystic lesion approximately 3.6 by 1.2 by 1.7 cm, most likely consistent with a Baker's cyst.  I suspect this is likely the etiology of the patient's symptoms.  Very low suspicion for any occult serious or life-threatening pathology.  Recommend that he follow-up with his primary care provider as needed.  We will provide him with a referral to orthopedics if desired.  Will discharge.  Considered admission for this patient, but given his stable presentation and unremarkable work-up, he is unlikely benefit from admission.  Provided the patient with anticipatory guidance, return precautions, and educational material. Encouraged the patient to return to the emergency department at any time if they begin to  experience any new or worsening symptoms. Patient expressed understanding and agreed with the plan.   Patient's presentation is most consistent with acute complicated illness / injury requiring diagnostic workup.       FINAL CLINICAL IMPRESSION(S) / ED DIAGNOSES   Final diagnoses:  Left leg pain     Rx / DC Orders   ED Discharge Orders     None        Note:  This document was prepared using Dragon voice recognition software and may include unintentional dictation errors.   Teodoro Spray, [place] 12/12/21 1710    Vanessa [place], MD 12/12/21 1721

## 2022-03-31 ENCOUNTER — Other Ambulatory Visit: Payer: Self-pay | Admitting: Internal Medicine

## 2022-03-31 DIAGNOSIS — M545 Low back pain, unspecified: Secondary | ICD-10-CM

## 2022-03-31 DIAGNOSIS — C9000 Multiple myeloma not having achieved remission: Secondary | ICD-10-CM

## 2022-03-31 DIAGNOSIS — M4856XA Collapsed vertebra, not elsewhere classified, lumbar region, initial encounter for fracture: Secondary | ICD-10-CM

## 2022-04-02 ENCOUNTER — Ambulatory Visit
Admission: RE | Admit: 2022-04-02 | Discharge: 2022-04-02 | Disposition: A | Discharge status: Home / Self Care | Payer: Medicare Other | Source: Ambulatory Visit | Attending: Internal Medicine | Admitting: Internal Medicine

## 2022-04-02 DIAGNOSIS — M4856XA Collapsed vertebra, not elsewhere classified, lumbar region, initial encounter for fracture: Secondary | ICD-10-CM | POA: Diagnosis present

## 2022-04-02 DIAGNOSIS — M545 Low back pain, unspecified: Secondary | ICD-10-CM

## 2022-04-02 DIAGNOSIS — C9000 Multiple myeloma not having achieved remission: Secondary | ICD-10-CM

## 2022-04-02 MED ORDER — GADOBUTROL 1 MMOL/ML IV SOLN
9.0000 mL | Freq: Once | INTRAVENOUS | Status: AC | PRN
  Administered 2022-04-02: 10 mL via INTRAVENOUS

## 2022-04-11 ENCOUNTER — Other Ambulatory Visit: Payer: Self-pay | Admitting: Physician Assistant

## 2022-04-11 DIAGNOSIS — M8088XA Other osteoporosis with current pathological fracture, vertebra(e), initial encounter for fracture: Secondary | ICD-10-CM

## 2022-04-12 ENCOUNTER — Other Ambulatory Visit: Payer: Self-pay | Admitting: Cardiology

## 2022-04-12 ENCOUNTER — Ambulatory Visit
Admission: RE | Admit: 2022-04-12 | Discharge: 2022-04-12 | Disposition: A | Discharge status: Home / Self Care | Payer: Medicare Other | Source: Ambulatory Visit | Attending: Physician Assistant | Admitting: Physician Assistant

## 2022-04-12 DIAGNOSIS — M8088XA Other osteoporosis with current pathological fracture, vertebra(e), initial encounter for fracture: Secondary | ICD-10-CM

## 2022-04-12 HISTORY — PX: IR RADIOLOGIST EVAL & MGMT: IMG5224

## 2022-04-12 NOTE — Consult Note (Signed)
Chief Complaint: Patient was seen in consultation today for L1 compression fracture at the request of Jimmy Hines,Jimmy Hines  Referring Physician(s): Jimmy Hines,Jimmy Hines  History of Present Illness: Jimmy Hines is a 87 y.o. male With a history of multiple myeloma (followed by Dr. Alvie Heidelberg at [facility]) presents with new onset severe back pain at the thoracolumbar junction.  He has had issues with balance and reports approximately 15 falls over the past year.  After his most recent fall (from standing onto the hardwood floor) he began experiencing severe pain at the junction of his thoracic and lumbar spine.  Subsequent MRI imaging demonstrates an acute/subacute compression fracture of the L1 vertebral body with approximately 40% height loss.  Additionally, there is a more subtle compression fracture of the anterior inferior endplate at 624THL without significant height loss.  There is some enhancement of the vertebral body raising the possibility of underlying neoplastic infiltrate, particularly given his history of known myeloma.  Jimmy Hines has been taking morphine 30 mg extended release every 12 hours as well as Tylenol 500 mg every 8 hours for his pain.  His pain remains quite severe.  He rates it at a 9 out of 10 on a 10 point scale.  He finds this pain quite debilitating.  He scored a 19 out of 24 on the [employer] disability questionnaire.  He denies new onset lower extremity weakness, numbness, paresthesias or changes in bowel/bladder function.  Past Medical History:  Diagnosis Date   Arthritis    BPH (benign prostatic hypertrophy)    Dysrhythmia    ireg hr, palpatations   Full dentures    GERD (gastroesophageal reflux disease)    ulcers   HOH (hard of hearing)    Hypertension    Neuropathy of foot    bilateral   Wears glasses    reading    Past Surgical History:  Procedure Laterality Date   ABDOMINAL HERNIA REPAIR  2000   CATARACT EXTRACTION W/PHACO Left 12/18/2016    Procedure: CATARACT EXTRACTION PHACO AND INTRAOCULAR LENS PLACEMENT (Jimmy Hines) LEFT;  Surgeon: Jimmy Koyanagi, MD;  Location: [place];  Service: Ophthalmology;  Laterality: Left;   CATARACT EXTRACTION W/PHACO Right 02/01/2017   Procedure: CATARACT EXTRACTION PHACO AND INTRAOCULAR LENS PLACEMENT (IOC);  Surgeon: Jimmy Koyanagi, MD;  Location: [facility];  Service: Ophthalmology;  Laterality: Right;  Lot # [id number] H VS :41.9 AP 14.7% CDE 6.15   CERVICAL LAMINECTOMY  1982   COLONOSCOPY     EYE SURGERY Left    cataract extraction   HALLUX VALGUS AUSTIN Right 02/23/2017   Procedure: HALLUX VALGUS DOUBLE OSTEOTOMY;  Surgeon: Jimmy Hines, DPM;  Location: [facility];  Service: Podiatry;  Laterality: Right;   HAMMER TOE SURGERY Right 02/23/2017   Procedure: HAMMER TOE CORRECTION-2ND & 3RD;  Surgeon: Jimmy Hines, DPM;  Location: [facility];  Service: Podiatry;  Laterality: Right;   HERNIA REPAIR  1995   rt ing    IR RADIOLOGIST EVAL & MGMT  04/12/2022   PLANTAR FASCIA SURGERY  1990   right   SHOULDER ARTHROSCOPY WITH ROTATOR CUFF REPAIR Right 01/06/2013   Procedure: RIGHT SHOULDER ARTHROSCOPY WITH ROTATOR CUFF REPAIR POSSIBLE DISTAL CLAVICAL EXCISION;  Surgeon: Jimmy Nap, MD;  Location: [place];  Service: Orthopedics;  Laterality: Right;   THUMB ARTHROSCOPY  2011   right and left   TONSILLECTOMY      Allergies: Patient has no known allergies.  Medications: Prior to Admission medications   Medication  Sig Start Date End Date Taking? Authorizing Provider  acetaminophen (TYLENOL) 500 MG tablet Take 1,000 mg by mouth every 6 (six) hours as needed for moderate pain or headache.    [provider]  Ascorbic Acid (VITAMIN C PO) Take 500 mg by mouth at bedtime.     [provider]  aspirin 325 MG tablet Take 325 mg by mouth at bedtime.     [provider]  celecoxib (CELEBREX) 200 MG capsule Take 200 mg by mouth at bedtime.  04/12/15    [provider]  Cyanocobalamin (VITAMIN B-12 PO) Take 1,200 mcg by mouth at bedtime.     [provider]  gabapentin (NEURONTIN) 300 MG capsule Take 300 mg by mouth at bedtime.    [provider]  ibuprofen (ADVIL,MOTRIN) 200 MG tablet Take 200 mg by mouth every 6 (six) hours as needed.    [provider]  lisinopril-hydrochlorothiazide (PRINZIDE,ZESTORETIC) 20-12.5 MG per tablet Take 1 tablet by mouth at bedtime.     [provider]  metoprolol succinate (TOPROL-XL) 100 MG 24 hr tablet Take 100 mg by mouth at bedtime. Take with or immediately following a meal.     [provider]  Multiple Vitamin (MULTIVITAMIN) capsule Take 1 capsule by mouth at bedtime.     [provider]  NONFORMULARY OR COMPOUNDED ITEM Peripheral neuropathy cream-[facility] 12/01/15   Hines, Jimmy T, DPM  omeprazole (PRILOSEC) 20 MG capsule Take 20 mg by mouth at bedtime.     [provider]  oxyCODONE-acetaminophen (PERCOCET) 5-325 MG tablet Take 1-2 tablets by mouth every 6 (six) hours as needed for severe pain. 02/23/17   Jimmy Hines, DPM  oxyCODONE-acetaminophen (PERCOCET/ROXICET) 5-325 MG tablet Take 1 tablet by mouth every 4 (four) hours as needed for severe pain. 04/15/18   Hines, Jimmy Dolin, PA-C  tamsulosin (FLOMAX) 0.4 MG CAPS capsule Take 0.4 mg by mouth at bedtime.     [provider]  vitamin E 400 UNIT capsule Take 400 Units by mouth at bedtime.     [provider]     No family history on file.  Social History   Socioeconomic History   Marital status: Widowed    Spouse name: Not on file   Number of children: Not on file   Years of education: Not on file   Highest education level: Not on file  Occupational History   Not on file  Tobacco Use   Smoking status: Former    Types: Cigarettes    Quit date: 12/28/1982    Years since quitting: 39.3   Smokeless tobacco: Never  Vaping Use   Vaping Use: Never used   Substance and Sexual Activity   Alcohol use: Yes    Comment: occ -Holidays   Drug use: No   Sexual activity: Not on file  Other Topics Concern   Not on file  Social History Narrative   Not on file   Social Determinants of Health   Financial Resource Strain: Not on file  Food Insecurity: Not on file  Transportation Needs: Not on file  Physical Activity: Not on file  Stress: Not on file  Social Connections: Not on file    Review of Systems: A 12 point ROS discussed and pertinent positives are indicated in the HPI above.  All other systems are negative.  Review of Systems  Vital Signs: BP 127/74 (BP Location: Left Arm, Patient Position: Sitting, Cuff Size: Normal)   Pulse 66   Temp  98.4 F (36.9 C) (Oral)   Resp 14   Wt 86.2 kg   SpO2 97%   BMI 26.50 kg/m    Physical Exam Constitutional:      Appearance: Normal appearance.  HENT:     Head: Normocephalic and atraumatic.  Eyes:     General: No scleral icterus. Cardiovascular:     Rate and Rhythm: Normal rate.  Pulmonary:     Effort: Pulmonary effort is normal.  Abdominal:     General: Abdomen is flat. There is no distension.     Tenderness: There is no abdominal tenderness.  Musculoskeletal:       Back:     Comments: Focal TTP at T12 and L1 spinous processes  Skin:    General: Skin is warm and dry.  Neurological:     Mental Status: He is alert and oriented to person, place, and time.  Psychiatric:        Behavior: Behavior normal.       Imaging: IR Radiologist Eval & Mgmt  Result Date: 04/12/2022 EXAM: NEW PATIENT OFFICE VISIT CHIEF COMPLAINT: SEE EPIC NOTE HISTORY OF PRESENT ILLNESS: SEE EPIC NOTE REVIEW OF SYSTEMS: SEE EPIC NOTE PHYSICAL EXAMINATION: SEE EPIC NOTE ASSESSMENT AND PLAN: SEE EPIC NOTE Electronically Signed   By: Jacqulynn Cadet M.D.   On: 04/12/2022 08:59   MR Lumbar Spine W Wo Contrast  Result Date: 04/04/2022 CLINICAL DATA:  Slipped and fell 3 weeks ago. Persistent back pain.  History of myeloma. EXAM: MRI LUMBAR SPINE WITHOUT AND WITH CONTRAST TECHNIQUE: Multiplanar and multiecho pulse sequences of the lumbar spine were obtained without and with intravenous contrast. CONTRAST:  46m GADAVIST GADOBUTROL 1 MMOL/ML IV SOLN COMPARISON:  MRI 03/21/2021 and 12/26/2017 FINDINGS: Segmentation:  5 lumbar type vertebral bodies. Alignment:  Mild scoliotic curvature convex to the right. Vertebrae: Acute/subacute compression fracture at L1 with loss of height 40%. Marrow edema and enhancement. In this patient with a history of myeloma, the findings are indeterminate for benign versus pathologic fracture. No extraosseous tumor. No canal compromise. There is abnormal edema and enhancement of the anterior inferior corner of the T12 vertebral body. This is most consistent with post traumatic change/minor fracture. Impossible to exclude myeloma involvement. Chronic discogenic endplate changes at [id number] L4-5 on the left with low level edema and enhancement. This is progressive since the prior exams. Conus medullaris and cauda equina: Conus extends to the L2 level. Conus and cauda equina appear normal. Paraspinal and other soft tissues: Negative Disc levels: T12-L1: Mild bulging of the disc. Posterior bowing of the posterosuperior margin of the L1 vertebral body. No compressive stenosis. L1-2: Bulging of the disc. Mild facet and ligamentous hypertrophy. Mild canal stenosis but no neural compression. L2-3: Shallow protrusion of the disc more prominent towards the left. Facet and ligamentous hypertrophy. Severe multifactorial spinal stenosis likely to be symptomatic. L3-4: Endplate osteophytes and shallow protrusion of the disc. Previous posterior decompression. Persistent stenosis of the lateral recesses and neural foramina, left worse than right. Neural compression could occur in these locations. L4-5: Bulging of the disc. Facet and ligamentous hypertrophy. Moderate multifactorial stenosis that could be  symptomatic. L5-S1: Endplate osteophytes and bulging of the disc. Facet and ligamentous hypertrophy. Stenosis of both subarticular lateral recesses and foramina that could be symptomatic, worse on the right than the left. Previously seen right foraminal synovial cyst is smaller. IMPRESSION: 1. Acute/subacute compression fracture at L1 with loss of height of 40%. Marrow edema and enhancement. In this patient with  a history of myeloma, the findings are indeterminate for benign versus pathologic fracture. No extraosseous tumor. 2. Abnormal edema and enhancement of the anterior inferior corner of the T12 vertebral body. This is most consistent with post traumatic change/minor fracture. Impossible to exclude myeloma involvement. 3. L2-3: Severe multifactorial spinal stenosis likely to be symptomatic. 4. L3-4: Previous posterior decompression. Persistent lateral recess and foraminal stenosis left worse than right that could cause neural compression. 5. L4-5: Moderate multifactorial stenosis that could be symptomatic. 6. L5-S1: Bilateral subarticular lateral recess and foraminal stenosis that could be symptomatic, worse on the right than the left. Electronically Signed   By: Nelson Chimes M.D.   On: 04/04/2022 15:27    Labs:  CBC: Recent Labs    12/12/21 1521  WBC 5.6  HGB 11.9*  HCT 35.7*  PLT 275    COAGS: No results for input(s): "INR", "APTT" in the last 8760 hours.  BMP: Recent Labs    12/12/21 1521  NA 140  Hines 4.0  CL 103  CO2 30  GLUCOSE 101*  BUN 19  CALCIUM 9.6  CREATININE 1.30*  GFRNONAA 54*    LIVER FUNCTION TESTS: Recent Labs    12/12/21 1521  BILITOT 0.7  AST 31  ALT 23  ALKPHOS 83  PROT 7.5  ALBUMIN 3.9    TUMOR MARKERS: No results for input(s): "AFPTM", "CEA", "CA199", "CHROMGRNA" in the last 8760 hours.  Assessment & Plan:   Patient has suffered subacute osteoporotic fracture (pathologic fracture not yet excluded given history of multiple myeloma) of the T12  and L1 vertebra.   History and exam have demonstrated the following:  Acute/Subacute fracture by imaging dated 04/02/2022, Pain on exam concordant with level of fracture, Failure of conservative therapy and pain refractory to narcotic pain mediation, and Significant disability on the [place] with 19/24 positive symptoms, reflecting significant impact/impairment of (ADLs)   ICD-10-CM Codes that Support Medical Necessity ([email or web address])  M80.08XA    Age-related osteoporosis with current pathological fracture, vertebra(e), initial encounter for fracture, S22.080A    Wedge compression fracture of T11-T12 vertebra, initial encounter for closed fracture , and S32.010A    Wedge compression fracture of first lumbar vertebra, initial encounter for closed fracture    Plan:  T12 and L1 vertebral body BIOPSY and augmentation with balloon kyphoplasty  Post-procedure disposition: outpatient [facility]  Medication holds: Eliquis  The patient has suffered a fracture of the T12 and L1 vertebral bodies. It is recommended that patients aged 20 years or older be evaluated for possible testing or treatment of osteoporosis. A copy of this consult report is sent to the patient's referring physician.  Advanced Care Plan: The patient did not want to provide an [place] at the time of this visit     Total time spent on today's visit was over  40 Minutes including both face-to-face time and non face-to-face time, personally spent on review of chart (including labs and relevant imaging), discussing further workup and treatment options, referral to specialist if needed, reviewing outside records if pertinent, answering patient questions, and coordinating care regarding symptomatic compression fractures at T12 and L1 as well as management strategy.    Electronically Signed: Antonietta Jewel Ranier Coach 04/12/2022, 9:42 AM

## 2022-04-19 MED ORDER — ACETAMINOPHEN 10 MG/ML IV SOLN
1000.0000 mg | Freq: Once | INTRAVENOUS | Status: AC
  Administered 2022-04-20: 1000 mg via INTRAVENOUS

## 2022-04-19 NOTE — Discharge Instructions (Signed)
Kyphoplasty Post Procedure Discharge Instructions  May resume a regular diet and any medications that you routinely take (including pain medications). However, if you are taking Aspirin or an anticoagulant/blood thinner you will be told when you can resume taking these by the healthcare provider. No driving day of procedure. The day of your procedure take it easy. You may use an ice pack as needed to injection sites on back.  Ice to back 30 minutes on and 30 minutes off, as needed. May remove bandaids tomorrow after taking a shower. Replace daily with a clean bandaid until healed.  Do not lift anything heavier than a milk jug for 1-2 weeks or determined by your physician.  Follow up with your physician in 2 weeks.    Please contact our office at [phone] for the following symptoms or if you have any questions:  Fever greater than 100 degrees Increased swelling, pain, or redness at injection site. Increased back and/or leg pain New numbness or change in symptoms from before the procedure.    Thank you for visiting [facility].  May resume Eliquis 24 hours after procedure! 

## 2022-04-20 ENCOUNTER — Ambulatory Visit
Admission: RE | Admit: 2022-04-20 | Discharge: 2022-04-20 | Disposition: A | Discharge status: Home / Self Care | Payer: Medicare Other | Source: Ambulatory Visit | Attending: Physician Assistant | Admitting: Physician Assistant

## 2022-04-20 ENCOUNTER — Other Ambulatory Visit: Payer: Self-pay | Admitting: Physician Assistant

## 2022-04-20 VITALS — BP 177/82 | HR 66 | Temp 98.0°F | Resp 13

## 2022-04-20 DIAGNOSIS — M8088XA Other osteoporosis with current pathological fracture, vertebra(e), initial encounter for fracture: Secondary | ICD-10-CM

## 2022-04-20 DIAGNOSIS — M8000XA Age-related osteoporosis with current pathological fracture, unspecified site, initial encounter for fracture: Secondary | ICD-10-CM

## 2022-04-20 HISTORY — PX: IR KYPHO LUMBAR INC FX REDUCE BONE BX UNI/BIL CANNULATION INC/IMAGING: IMG5519

## 2022-04-20 HISTORY — PX: IR KYPHO THORACIC WITH BONE BIOPSY: IMG5518

## 2022-04-20 MED ORDER — MIDAZOLAM HCL 2 MG/2ML IJ SOLN
1.0000 mg | INTRAMUSCULAR | Status: DC | PRN
  Administered 2022-04-20 (×2): 1 mg via INTRAVENOUS
  Administered 2022-04-20 (×2): 0.5 mg via INTRAVENOUS

## 2022-04-20 MED ORDER — CEFAZOLIN SODIUM-DEXTROSE 2-4 GM/100ML-% IV SOLN
2.0000 g | INTRAVENOUS | Status: AC
  Administered 2022-04-20: 2 g via INTRAVENOUS

## 2022-04-20 MED ORDER — FENTANYL CITRATE PF 50 MCG/ML IJ SOSY
25.0000 ug | PREFILLED_SYRINGE | INTRAMUSCULAR | Status: DC | PRN
  Administered 2022-04-20: 50 ug via INTRAVENOUS

## 2022-04-20 MED ORDER — SODIUM CHLORIDE 0.9 % IV SOLN: INTRAVENOUS | Status: DC

## 2022-04-20 NOTE — Procedures (Signed)
Interventional Radiology Procedure Note  Procedure:   Image guided biopsy of T12 and L1, suspicion of mx myeloma.  Image guided KP of T12 and L1 for acute compression fracture.  .  Complications: None Sample: T12 path specimen bone biopsy L1 path specimen bone biopsy  Recommendations:  - routine wound care - stable to observation.  1 hr recovery is anticipated - advance diet - Ok to shower tomorrow - Do not submerge for 7 days - follow up in clinic in 4-6 weeks  - follow up pathology   Signed,  Dulcy Fanny. Earleen Newport, DO

## 2022-04-27 ENCOUNTER — Other Ambulatory Visit: Payer: Self-pay | Admitting: Interventional Radiology

## 2022-04-27 ENCOUNTER — Telehealth: Payer: Self-pay

## 2022-04-27 DIAGNOSIS — S22000A Wedge compression fracture of unspecified thoracic vertebra, initial encounter for closed fracture: Secondary | ICD-10-CM

## 2022-04-27 NOTE — Telephone Encounter (Signed)
Phone call to pt to follow up from his kyphoplasty on 04/20/22. Pt reports his sharp pain is completely gone post procedure but is still having "a little soreness". Pt reports he is able to move around a little better. Pt denies any signs of infection, redness at the site, draining or fever. Pt has no complaints at this time and will be scheduled for a telephone follow up with Dr. Earleen Newport soon. Pt advised to call back if anything were to change or any concerns arise and we will arrange an in person appointment. Pt verbalized understanding.

## 2022-05-04 LAB — SURGICAL PATHOLOGY

## 2022-05-11 ENCOUNTER — Ambulatory Visit
Admission: RE | Admit: 2022-05-11 | Discharge: 2022-05-11 | Disposition: A | Discharge status: Home / Self Care | Payer: Medicare Other | Source: Ambulatory Visit | Attending: Interventional Radiology | Admitting: Interventional Radiology

## 2022-05-11 DIAGNOSIS — S22000A Wedge compression fracture of unspecified thoracic vertebra, initial encounter for closed fracture: Secondary | ICD-10-CM

## 2022-05-11 HISTORY — PX: IR RADIOLOGIST EVAL & MGMT: IMG5224

## 2022-05-11 NOTE — Progress Notes (Signed)
Chief Complaint: SP treatment of T12 and L1 compression fracture.   Referring Physician(s): Johany Hansman  History of Present Illness: Jimmy Hines is a 87 y.o. male presenting as a scheduled post-op visit to Jimmy, SP vertebral augmentation with KP technique 04/20/22.   Jimmy Hines joins [place] by telemedicine visit today, and we confirmed his identity with 2 personal identifiers.   History:  He has history of multiple myeloma and was referred to [place] 04/12/22 with new onset severe back pain at the thoracolumbar junction.  He has had issues with balance and reports approximately 15 falls over the past year.  After another fall he began experiencing severe pain at the junction of his thoracic and lumbar spine.  Subsequent MRI imaging confirmed acute/subacute compression fracture of the L1 vertebral body with approximately 40% height loss.  Additionally, a more subtle compression fracture of the anterior inferior endplate at 624THL without significant height loss.  Some enhancement of the vertebral body raised possibility of underlying neoplastic infiltrate, particularly given his history of known myeloma.   Interval history:  We treated Jimmy Hines 04/20/22 with vertebral augmentation of the T12 and L1, kyphoplasty technique.  At the time, we took biopsies at both levels. He was discharged on the same day.   Path is negative:    He reports today that he is feeling much better, but still has some soreness that comes on typically after activity.  He says he "gets tired quickly".  He feels this is usual musculoskeletal pain, and is not concerned that he has another fracture.  He is overall comfortable, and is doing his usual activities. The sites of access have healed, with no concern. .  Past Medical History:  Diagnosis Date   Arthritis    BPH (benign prostatic hypertrophy)    Dysrhythmia    ireg hr, palpatations   Full dentures    GERD (gastroesophageal reflux disease)    ulcers   HOH  (hard of hearing)    Hypertension    Neuropathy of foot    bilateral   Wears glasses    reading    Past Surgical History:  Procedure Laterality Date   ABDOMINAL HERNIA REPAIR  2000   CATARACT EXTRACTION W/PHACO Left 12/18/2016   Procedure: CATARACT EXTRACTION PHACO AND INTRAOCULAR LENS PLACEMENT ([place]) LEFT;  Surgeon: Leandrew Koyanagi, MD;  Location: [facility];  Service: Ophthalmology;  Laterality: Left;   CATARACT EXTRACTION W/PHACO Right 02/01/2017   Procedure: CATARACT EXTRACTION PHACO AND INTRAOCULAR LENS PLACEMENT ([facility]);  Surgeon: Leandrew Koyanagi, MD;  Location: [facility];  Service: Ophthalmology;  Laterality: Right;  Lot # [id number] H VS :41.9 AP 14.7% CDE 6.15   CERVICAL LAMINECTOMY  1982   COLONOSCOPY     EYE SURGERY Left    cataract extraction   HALLUX VALGUS AUSTIN Right 02/23/2017   Procedure: HALLUX VALGUS DOUBLE OSTEOTOMY;  Surgeon: Samara Deist, DPM;  Location: [facility];  Service: Podiatry;  Laterality: Right;   HAMMER TOE SURGERY Right 02/23/2017   Procedure: HAMMER TOE CORRECTION-2ND & 3RD;  Surgeon: Samara Deist, DPM;  Location: [facility];  Service: Podiatry;  Laterality: Right;   HERNIA REPAIR  1995   rt ing    IR KYPHO LUMBAR INC FX REDUCE BONE BX UNI/BIL CANNULATION INC/IMAGING  04/20/2022   IR KYPHO THORACIC WITH BONE BIOPSY  04/20/2022   IR RADIOLOGIST EVAL & MGMT  04/12/2022   PLANTAR FASCIA SURGERY  1990   right   SHOULDER ARTHROSCOPY WITH ROTATOR CUFF REPAIR Right  01/06/2013   Procedure: RIGHT SHOULDER ARTHROSCOPY WITH ROTATOR CUFF REPAIR POSSIBLE DISTAL CLAVICAL EXCISION;  Surgeon: Jolyn Nap, MD;  Location: [facility] [place];  Service: Orthopedics;  Laterality: Right;   THUMB ARTHROSCOPY  2011   right and left   TONSILLECTOMY      Allergies: Patient has no known allergies.  Medications: Prior to Admission medications   Medication Sig Start Date End Date Taking? Authorizing Provider  acetaminophen (TYLENOL) 500 MG tablet  Take 1,000 mg by mouth every 6 (six) hours as needed for moderate pain or headache.    [provider]  acyclovir (ZOVIRAX) 400 MG tablet Take 400 mg by mouth 2 (two) times daily.    [provider]  apixaban (ELIQUIS) 5 MG TABS tablet Take 5 mg by mouth 2 (two) times daily.    [provider]  Ascorbic Acid (VITAMIN C PO) Take 500 mg by mouth at bedtime.     [provider]  celecoxib (CELEBREX) 200 MG capsule Take 200 mg by mouth at bedtime.  04/12/15   [provider]  Cholecalciferol (VITAMIN D3) 50 MCG (2000 UT) CAPS Take 1 capsule by mouth once.    [provider]  colchicine 0.6 MG tablet Take 0.6 mg by mouth daily.    [provider]  Cyanocobalamin (VITAMIN B-12 PO) Take 1,200 mcg by mouth at bedtime.     [provider]  gabapentin (NEURONTIN) 400 MG capsule Take 400 mg by mouth once.    [provider]  gabapentin (NEURONTIN) 800 MG tablet Take 800 mg by mouth 2 (two) times daily.    [provider]  ibuprofen (ADVIL,MOTRIN) 200 MG tablet Take 200 mg by mouth every 6 (six) hours as needed. Patient not taking: Reported on 04/12/2022    [provider]  lenalidomide (REVLIMID) 5 MG capsule Take 5 mg by mouth daily. [employer] Auth # unknown     Date Obtained unknown    [provider]  lisinopril-hydrochlorothiazide (PRINZIDE,ZESTORETIC) 20-12.5 MG per tablet Take 1 tablet by mouth at bedtime.     [provider]  metoprolol succinate (TOPROL-XL) 100 MG 24 hr tablet Take 100 mg by mouth at bedtime. Take with or immediately following a meal.     [provider]  morphine (MS CONTIN) 30 MG 12 hr tablet Take 30 mg by mouth every 12 (twelve) hours.    [provider]  Multiple Vitamin (MULTIVITAMIN) capsule Take 1 capsule by mouth at bedtime.     [provider]  NONFORMULARY OR COMPOUNDED ITEM Peripheral neuropathy cream-[facility] Patient not taking:  Reported on 04/12/2022 12/01/15   Hyatt, Max T, DPM  omeprazole (PRILOSEC) 20 MG capsule Take 20 mg by mouth at bedtime.     [provider]  oxyCODONE-acetaminophen (PERCOCET) 5-325 MG tablet Take 1-2 tablets by mouth every 6 (six) hours as needed for severe pain. Patient not taking: Reported on 04/12/2022 02/23/17   Samara Deist, DPM  oxyCODONE-acetaminophen (PERCOCET/ROXICET) 5-325 MG tablet Take 1 tablet by mouth every 4 (four) hours as needed for severe pain. Patient not taking: Reported on 04/12/2022 04/15/18   Versie Starks, PA-C  tamsulosin (FLOMAX) 0.4 MG CAPS capsule Take 0.4 mg by mouth at bedtime.  Patient not taking: Reported on 04/12/2022    [provider]  torsemide (DEMADEX) 20 MG tablet Take 40 mg by mouth daily.    [provider]  vitamin E 400 UNIT capsule Take 400 Units by mouth at  bedtime.     [provider]     No family history on file.  Social History   Socioeconomic History   Marital status: Widowed    Spouse name: Not on file   Number of children: Not on file   Years of education: Not on file   Highest education level: Not on file  Occupational History   Not on file  Tobacco Use   Smoking status: Former    Types: Cigarettes    Quit date: 12/28/1982    Years since quitting: 39.3   Smokeless tobacco: Never  Vaping Use   Vaping Use: Never used  Substance and Sexual Activity   Alcohol use: Yes    Comment: occ -Holidays   Drug use: No   Sexual activity: Not on file  Other Topics Concern   Not on file  Social History Narrative   Not on file   Social Determinants of Health   Financial Resource Strain: Not on file  Food Insecurity: Not on file  Transportation Needs: Not on file  Physical Activity: Not on file  Stress: Not on file  Social Connections: Not on file      Review of Systems  Review of Systems: A 12 point ROS discussed and pertinent positives are indicated in the HPI above.  All other systems are  negative.     Physical Exam No direct physical exam was performed (except for noted visual exam findings with Video Visits).    Vital Signs: There were no vitals taken for this visit.  Imaging: IR KYPHO LUMBAR INC FX REDUCE BONE BX UNI/BIL CANNULATION INC/IMAGING  Result Date: 04/20/2022 INDICATION: 87 year old male presents for image guided biopsy of T12 and L1 compression fracture site, with kyphoplasty planned at both sites for acute fracture Patient has history of multiple myeloma EXAM: IR KYPHO VERTEBRAL LUMBAR AUGMENTATION; IR KYPHO VERTEBRAL THORACIC AUGMENTATION COMPARISON:  Jimmy 04/02/2022 MEDICATIONS: As antibiotic prophylaxis, 2 g Ancef was ordered pre-procedure and administered intravenously within 1 hour of incision. 1000 mg IV Tylenol ANESTHESIA/SEDATION: Moderate (conscious) sedation was employed during this procedure. A total of Versed 3.0 mg and Fentanyl 50 mcg was administered intravenously. Moderate Sedation Time: 52 minutes. The patient's level of consciousness and vital signs were monitored continuously by radiology nursing throughout the procedure under my direct supervision. FLUOROSCOPY TIME:  Fluoroscopy Time:   (123456 mGy) COMPLICATIONS: None PROCEDURE: Following a full explanation of the procedure along with the potentially associated complications, a witnessed informed consent was obtained. Specific risks that were discussed included bleeding, infection, injury to adjacent structures, neurologic injury, embolization of cement within the veins, failure of the procedure to improve pain, need for further procedure/ surgery, cardiopulmonary collapse, death. The patient understands the risks and wishes to proceed. The patient was placed prone on the fluoroscopic table. Nasal oxygen was administered. Physiologic monitoring was performed throughout the duration of the procedure. The skin overlying the T12-L1 region was prepped and draped in the usual sterile fashion. First the T12  vertebral body was identified and the right pedicle was infiltrated with 1% lidocaine. This was then followed by the advancement of pedicle cannula through the pedicle into the mid vertebral body. Biopsy device was then passed under fluoroscopic guidance through the needle. Biopsy was acquired in placed into the specimen cup. The needle was then repositioned slightly more medial and superior, with use of the Kyphon guide needle. A second bone specimen was acquired at this location and placed into the specimen solution. Cannula needle was  then replaced and we proceeded with access of the L1 level. We identified L1 under fluoroscopy. The left pedicle was then infiltrated with 1% lidocaine. A small incision was made with 11 blade scalpel, and then the Kyphon pedicle cannula was advanced through the left pedicle into the mid vertebral body. Once the needle was position under fluoroscopy, a biopsy device was passed acquiring a core biopsy through the vertebral body. This was placed into the specimen solution. The bone auger was then passed through the needle, and both AP and lateral images were acquired. Given that this needle was slightly to the left of the midline position of the vertebral body we elected to place a second pedicle access. The right L1 pedicle was then identified and infiltrated with 1% lidocaine. A small incision was made with 11 blade scalpel, and then a Kyphon pedicle cannula was advanced through the right pedicle into the mid vertebral body. The bone auger was passed, achieving a parallel access to the left-sided pedicle access. We then proceeded with simultaneous inflation of the left and right pedicular cannula balloons, performed under fluoroscopic observation. The left pedicle balloon was then deflated and removed and a needle was placed through the cannula. The balloon was then inserted into the right pedicle access at the T12 level. Balloon was inflated under fluoroscopic observation at the  T12 level. Methylmethacrylate mixture was then reconstituted. Under biplane intermittent fluoroscopy, the methylmethacrylate was then injected into first the L1 level, left pedicle, then right pedicle with filling of the fracture cleft. Images were stored. We then proceeded with injection of methylmethacrylate mixture at the T12 level under biplane fluoroscopic observation. Filling of the anterior fracture site and the mid vertebral body was achieved. No extravasation was noted posteriorly into the spinal canal. Trace T12 paravertebral venous contamination was observed, immediately recognized, that did not extend beyond the disc space. The needles were then removed. Hemostasis was achieved at the skin entry site. Final images were acquired. There were no acute complications. Patient tolerated the procedure well. IMPRESSION: Status post image guided biopsy of T12 and L1 fracture sites given the history of multiple myeloma, as well as treatment of acute compression fractures at T12 via right unipedicular approach, and L1 via bipedicular approach. Signed, Dulcy Fanny. Nadene Rubins, RPVI [facility] Radiology Electronically Signed   By: Corrie Mckusick D.O.   On: 04/20/2022 10:58   IR KYPHO THORACIC WITH BONE BIOPSY  Result Date: 04/20/2022 INDICATION: 87 year old male presents for image guided biopsy of T12 and L1 compression fracture site, with kyphoplasty planned at both sites for acute fracture Patient has history of multiple myeloma EXAM: IR KYPHO VERTEBRAL LUMBAR AUGMENTATION; IR KYPHO VERTEBRAL THORACIC AUGMENTATION COMPARISON:  Jimmy 04/02/2022 MEDICATIONS: As antibiotic prophylaxis, 2 g Ancef was ordered pre-procedure and administered intravenously within 1 hour of incision. 1000 mg IV Tylenol ANESTHESIA/SEDATION: Moderate (conscious) sedation was employed during this procedure. A total of Versed 3.0 mg and Fentanyl 50 mcg was administered intravenously. Moderate  Sedation Time: 52 minutes. The patient's level of consciousness and vital signs were monitored continuously by radiology nursing throughout the procedure under my direct supervision. FLUOROSCOPY TIME:  Fluoroscopy Time:   (123456 mGy) COMPLICATIONS: None PROCEDURE: Following a full explanation of the procedure along with the potentially associated complications, a witnessed informed consent was obtained. Specific risks that were discussed included bleeding, infection, injury to adjacent structures, neurologic injury, embolization of cement within the veins, failure of the procedure to improve pain, need for  further procedure/ surgery, cardiopulmonary collapse, death. The patient understands the risks and wishes to proceed. The patient was placed prone on the fluoroscopic table. Nasal oxygen was administered. Physiologic monitoring was performed throughout the duration of the procedure. The skin overlying the T12-L1 region was prepped and draped in the usual sterile fashion. First the T12 vertebral body was identified and the right pedicle was infiltrated with 1% lidocaine. This was then followed by the advancement of pedicle cannula through the pedicle into the mid vertebral body. Biopsy device was then passed under fluoroscopic guidance through the needle. Biopsy was acquired in placed into the specimen cup. The needle was then repositioned slightly more medial and superior, with use of the Kyphon guide needle. A second bone specimen was acquired at this location and placed into the specimen solution. Cannula needle was then replaced and we proceeded with access of the L1 level. We identified L1 under fluoroscopy. The left pedicle was then infiltrated with 1% lidocaine. A small incision was made with 11 blade scalpel, and then the Kyphon pedicle cannula was advanced through the left pedicle into the mid vertebral body. Once the needle was position under fluoroscopy, a biopsy device was passed acquiring a core biopsy  through the vertebral body. This was placed into the specimen solution. The bone auger was then passed through the needle, and both AP and lateral images were acquired. Given that this needle was slightly to the left of the midline position of the vertebral body we elected to place a second pedicle access. The right L1 pedicle was then identified and infiltrated with 1% lidocaine. A small incision was made with 11 blade scalpel, and then a Kyphon pedicle cannula was advanced through the right pedicle into the mid vertebral body. The bone auger was passed, achieving a parallel access to the left-sided pedicle access. We then proceeded with simultaneous inflation of the left and right pedicular cannula balloons, performed under fluoroscopic observation. The left pedicle balloon was then deflated and removed and a needle was placed through the cannula. The balloon was then inserted into the right pedicle access at the T12 level. Balloon was inflated under fluoroscopic observation at the T12 level. Methylmethacrylate mixture was then reconstituted. Under biplane intermittent fluoroscopy, the methylmethacrylate was then injected into first the L1 level, left pedicle, then right pedicle with filling of the fracture cleft. Images were stored. We then proceeded with injection of methylmethacrylate mixture at the T12 level under biplane fluoroscopic observation. Filling of the anterior fracture site and the mid vertebral body was achieved. No extravasation was noted posteriorly into the spinal canal. Trace T12 paravertebral venous contamination was observed, immediately recognized, that did not extend beyond the disc space. The needles were then removed. Hemostasis was achieved at the skin entry site. Final images were acquired. There were no acute complications. Patient tolerated the procedure well. IMPRESSION: Status post image guided biopsy of T12 and L1 fracture sites given the history of multiple myeloma, as well as  treatment of acute compression fractures at T12 via right unipedicular approach, and L1 via bipedicular approach. Signed, Dulcy Fanny. Nadene Rubins, RPVI [facility] Radiology Electronically Signed   By: Corrie Mckusick D.O.   On: 04/20/2022 10:58   IR Radiologist Eval & Mgmt  Result Date: 04/12/2022 EXAM: NEW PATIENT OFFICE VISIT CHIEF COMPLAINT: SEE EPIC NOTE HISTORY OF PRESENT ILLNESS: SEE EPIC NOTE REVIEW OF SYSTEMS: SEE EPIC NOTE PHYSICAL EXAMINATION: SEE EPIC NOTE ASSESSMENT AND PLAN: SEE EPIC NOTE  Electronically Signed   By: Jacqulynn Cadet M.D.   On: 04/12/2022 08:59    Labs:  CBC: Recent Labs    12/12/21 1521  WBC 5.6  HGB 11.9*  HCT 35.7*  PLT 275    COAGS: No results for input(s): "INR", "APTT" in the last 8760 hours.  BMP: Recent Labs    12/12/21 1521  NA 140  K 4.0  CL 103  CO2 30  GLUCOSE 101*  BUN 19  CALCIUM 9.6  CREATININE 1.30*  GFRNONAA 54*    LIVER FUNCTION TESTS: Recent Labs    12/12/21 1521  BILITOT 0.7  AST 31  ALT 23  ALKPHOS 83  PROT 7.5  ALBUMIN 3.9    TUMOR MARKERS: No results for input(s): "AFPTM", "CEA", "CA199", "CHROMGRNA" in the last 8760 hours.  Assessment and Plan:   Jimmy Hines is an 87 yo male SP treatment of T12 and L1 compression fractures 3/14, confirmed benign on biopsy, with kyphoplasty technique.   He has had excellent result, and his pain is very much reduced.  He is comfortable with his activities, with some "tiredness" and "manageable" pain.  Overall he is very pleased with the outcome.   We discussed weight-bearing activity as a means of maintaining bone density, within his limits given the history of falls.  He understands.   Follow up on as-need basis.   Electronically Signed: Corrie Mckusick 05/11/2022, 10:37 AM   I spent a total of    15 Minutes in remote  clinical consultation, greater than 50% of which was counseling/coordinating care for T12 and L1  kyphoplasty for compression fracture. .    Visit type: Audio only (telephone). Audio (no video) only due to patient's lack of internet/smartphone capability. Alternative for in-person consultation at [facility], [address] [place], [place], [place]. This visit type was conducted due to national recommendations for restrictions regarding the COVID-19 Pandemic (e.g. social distancing).  This format is felt to be most appropriate for this patient at this time.  All issues noted in this document were discussed and addressed.

## 2022-07-11 ENCOUNTER — Emergency Department
Admission: EM | Admit: 2022-07-11 | Discharge: 2022-07-11 | Disposition: A | Discharge status: Home / Self Care | Payer: Medicare Other | Attending: Student in an Organized Health Care Education/Training Program | Admitting: Student in an Organized Health Care Education/Training Program

## 2022-07-11 ENCOUNTER — Emergency Department: Payer: Medicare Other

## 2022-07-11 ENCOUNTER — Other Ambulatory Visit: Payer: Self-pay

## 2022-07-11 DIAGNOSIS — W010XXA Fall on same level from slipping, tripping and stumbling without subsequent striking against object, initial encounter: Secondary | ICD-10-CM | POA: Insufficient documentation

## 2022-07-11 DIAGNOSIS — S0081XA Abrasion of other part of head, initial encounter: Secondary | ICD-10-CM | POA: Diagnosis not present

## 2022-07-11 DIAGNOSIS — S0990XA Unspecified injury of head, initial encounter: Secondary | ICD-10-CM

## 2022-07-11 DIAGNOSIS — W19XXXA Unspecified fall, initial encounter: Secondary | ICD-10-CM

## 2022-07-11 DIAGNOSIS — Y92007 Garden or yard of unspecified non-institutional (private) residence as the place of occurrence of the external cause: Secondary | ICD-10-CM | POA: Diagnosis not present

## 2022-07-11 DIAGNOSIS — Z23 Encounter for immunization: Secondary | ICD-10-CM | POA: Diagnosis not present

## 2022-07-11 MED ORDER — TETANUS-DIPHTH-ACELL PERTUSSIS 5-2.5-18.5 LF-MCG/0.5 IM SUSY
0.5000 mL | PREFILLED_SYRINGE | Freq: Once | INTRAMUSCULAR | Status: AC
  Administered 2022-07-11: 0.5 mL via INTRAMUSCULAR
  Filled 2022-07-11: qty 0.5

## 2022-07-11 NOTE — ED Triage Notes (Signed)
Pt. To ED from home via EMS for mechanical fall in front yard. Pt. States he braced for fall with arms, hit forehead on grass/gravel. Pt. Is on blood thinners, denies LOC or pain currently.

## 2022-07-11 NOTE — Discharge Instructions (Signed)
Your CT scans did not show any acute injuries.  However, as we discussed given that you are on Eliquis you are at slightly increased risk for delayed head bleed.  Please return the emergency department if you develop severe headache, vomiting, vision changes, or any other concerns.  It was a pleasure caring for you today.

## 2022-07-11 NOTE — ED Provider Notes (Signed)
[facility] Provider Note    Event Date/Time   First MD Initiated Contact with Patient 07/11/22 1140     (approximate)   History   Fall   HPI  Jimmy Hines is a 87 y.o. male with a past medical history of multiple myeloma, neuropathy, mild cognitive impairment who presents for evaluation after a fall.  Patient reports that he tripped and fell forward, onto his hands and struck his face.  Patient denies loss of consciousness.  Reports that he has a scrape on his nose, but no headache, neck pain, pain in his extremities, vomiting, or visual changes.  He denies any preceding symptoms.  Denies chest pain, shortness breath, abdominal pain.  No weakness or paresthesias.  He reports that he takes Eliquis daily.  There are no problems to display for this patient.         Physical Exam   Triage Vital Signs: ED Triage Vitals  Enc Vitals Group     BP      Pulse      Resp      Temp      Temp src      SpO2      Weight      Height      Head Circumference      Peak Flow      Pain Score      Pain Loc      Pain Edu?      Excl. in GC?     Most recent vital signs: Vitals:   07/11/22 1209  BP: (!) 147/93  Pulse: 71  Resp: 16  Temp: 98.1 F (36.7 C)  SpO2: 97%    Physical Exam Vitals and nursing note reviewed.  Constitutional:      General: Awake and alert. No acute distress.    Appearance: Normal appearance. The patient is normal weight.  HENT:     Head: Normocephalic.  Superficial abrasion to nasal bridge.  No septal hematoma.  No midface tenderness.  No mandibular tenderness.  No trismus    Mouth: Mucous membranes are moist.  Eyes:     General: PERRL. Normal EOMs        Right eye: No discharge.        Left eye: No discharge.     Conjunctiva/sclera: Conjunctivae normal.  Cardiovascular:     Rate and Rhythm: Normal rate a.     Pulses: Normal pulses.  Pulmonary:     Effort: Pulmonary effort is normal. No respiratory distress.      Breath sounds: Normal breath sounds.  No chest wall tenderness Abdominal:     Abdomen is soft. There is no abdominal tenderness. No rebound or guarding. No distention. Musculoskeletal:        General: No swelling. Normal range of motion.     Cervical back: Normal range of motion and neck supple. No midline cervical spine tenderness.  Full range of motion of neck.  Negative Spurling test.  Negative Lhermitte sign.  Normal strength and sensation in bilateral upper extremities. Normal grip strength bilaterally.  Normal intrinsic muscle function of the hand bilaterally.  Normal radial pulses bilaterally. Skin:    General: Skin is warm and dry.     Capillary Refill: Capillary refill takes less than 2 seconds.     Findings: No rash.  Bruising to face as described above Neurological:     Mental Status: The patient is awake and alert.   Neurological: GCS 15 alert  and oriented x3 Normal speech, no expressive or receptive aphasia or dysarthria Cranial nerves II through XII intact Normal visual fields 5 out of 5 strength in all 4 extremities with intact sensation throughout No extremity drift Normal finger-to-nose testing, no limb or truncal ataxia    ED Results / Procedures / Treatments   Labs (all labs ordered are listed, but only abnormal results are displayed) Labs Reviewed - No data to display   EKG     RADIOLOGY I independently reviewed and interpreted imaging and agree with radiologists findings.     PROCEDURES:  Critical Care performed:   Procedures   MEDICATIONS ORDERED IN ED: Medications  Tdap (BOOSTRIX) injection 0.5 mL (0.5 mLs Intramuscular Given 07/11/22 1206)     IMPRESSION / MDM / ASSESSMENT AND PLAN / ED COURSE  I reviewed the triage vital signs and the nursing notes.   Differential diagnosis includes, but is not limited to, abrasion, contusion, concussion, intracranial hemorrhage, cervical spine injury, facial fracture.  Patient is awake and alert,  hemodynamically stable and afebrile.  He is neurologically and neurovascularly intact.  He is ambulatory with a steady gait.  He has full and normal range of motion of bilateral upper and lower extremities.  He has no cervical spine tenderness, normal strength and sensation bilateral upper extremities, normal grip strength, not consistent with central cord syndrome.  CT head, neck, and face obtained and is negative for any acute findings.  Abrasion was cleaned, not suturable.  He was given an updated tetanus shot.  We discussed return precautions the importance of close outpatient follow-up.  We also discussed the possibility of delayed head bleed given that he is on anticoagulation.  We discussed return precautions.  Patient and his son understand and agree with plan.  He was discharged in stable condition.   Patient's presentation is most consistent with acute complicated illness / injury requiring diagnostic workup.    FINAL CLINICAL IMPRESSION(S) / ED DIAGNOSES   Final diagnoses:  Fall, initial encounter  Facial abrasion, initial encounter  Injury of head, initial encounter     Rx / DC Orders   ED Discharge Orders     None        Note:  This document was prepared using Dragon voice recognition software and may include unintentional dictation errors.   Keturah Shavers 07/11/22 1715    Willy Eddy, MD 07/11/22 [phone]

## 2023-02-23 ENCOUNTER — Other Ambulatory Visit: Payer: Self-pay

## 2023-02-23 ENCOUNTER — Emergency Department: Payer: Medicare Other

## 2023-02-23 ENCOUNTER — Emergency Department
Admission: EM | Admit: 2023-02-23 | Discharge: 2023-02-23 | Disposition: A | Discharge status: Home / Self Care | Payer: Medicare Other | Attending: Emergency Medicine | Admitting: Emergency Medicine

## 2023-02-23 DIAGNOSIS — S0003XA Contusion of scalp, initial encounter: Secondary | ICD-10-CM | POA: Diagnosis not present

## 2023-02-23 DIAGNOSIS — W01198A Fall on same level from slipping, tripping and stumbling with subsequent striking against other object, initial encounter: Secondary | ICD-10-CM | POA: Diagnosis not present

## 2023-02-23 DIAGNOSIS — I10 Essential (primary) hypertension: Secondary | ICD-10-CM | POA: Diagnosis not present

## 2023-02-23 DIAGNOSIS — S0990XA Unspecified injury of head, initial encounter: Secondary | ICD-10-CM | POA: Diagnosis present

## 2023-02-23 NOTE — ED Triage Notes (Signed)
Pt has a small laceration to the top of his head d/t a fall this AM. Pt is ambulatory with a cane. A/Ox4

## 2023-02-23 NOTE — ED Provider Notes (Signed)
[facility] Provider Note    Event Date/Time   First MD Initiated Contact with Patient 02/23/23 1608     (approximate)   History   Fall   HPI  Jimmy Hines is a 88 y.o. male with a history of hypertension, GERD who comes to the ED due to head injury.  Patient reports that he fell asleep while looking at the window, fall and hit his head.  Denies loss of consciousness, denies neck pain.  Not sure when his last tetanus shot was.  Currently feels fine, no vision changes paresthesias vomiting or change in balance or coordination.  Want to be evaluated to ensure he does not have a laceration.  Per chart review, pt had tdap updated 07/11/2022 .     Physical Exam   Triage Vital Signs: ED Triage Vitals  Encounter Vitals Group     BP 02/23/23 1458 (!) 140/87     Systolic BP Percentile --      Diastolic BP Percentile --      Pulse Rate 02/23/23 1458 62     Resp 02/23/23 1458 17     Temp 02/23/23 1458 98.2 F (36.8 C)     Temp Source 02/23/23 1458 Oral     SpO2 02/23/23 1458 98 %     Weight 02/23/23 1459 185 lb (83.9 kg)     Height 02/23/23 1459 5\' 10"  (1.778 m)     Head Circumference --      Peak Flow --      Pain Score 02/23/23 1458 0     Pain Loc --      Pain Education --      Exclude from Growth Chart --     Most recent vital signs: Vitals:   02/23/23 1458  BP: (!) 140/87  Pulse: 62  Resp: 17  Temp: 98.2 F (36.8 C)  SpO2: 98%    General: Awake, no distress.  CV:  Good peripheral perfusion.  Resp:  Normal effort.  Abd:  No distention.  Other:  No midline spinal tenderness.  There is a area of abrasion over the vertex of the skull.  Skin is intact, but there is a small palpable subcutaneous defect measuring about 1 cm in length and 3 mm in width.     ED Results / Procedures / Treatments   Labs (all labs ordered are listed, but only abnormal results are displayed) Labs Reviewed - No data to display   RADIOLOGY CT head  interpreted by me, negative for intracranial hemorrhage.  Radiology report reviewed  CT cervical spine unremarkable  PROCEDURES:  Procedures   MEDICATIONS ORDERED IN ED: Medications - No data to display   IMPRESSION / MDM / ASSESSMENT AND PLAN / ED COURSE  I reviewed the triage vital signs and the nursing notes.                              Differential diagnosis includes, but is not limited to, intracranial hemorrhage, C-spine fracture, skull fracture, contusion  Patient presents with blunt head trauma from mechanical fall.  Skin is intact, but there is a palpable subcutaneous defect.  Offered suturing to repair the subcutaneous space, patient declines and prefers to allow it to heal secondarily.  No significant traumatic injuries, stable for discharge     FINAL CLINICAL IMPRESSION(S) / ED DIAGNOSES   Final diagnoses:  Contusion of scalp, initial encounter  Rx / DC Orders   ED Discharge Orders     None        Note:  This document was prepared using Dragon voice recognition software and may include unintentional dictation errors.   Sharman Cheek, MD 02/23/23 2229

## 2023-02-23 NOTE — ED Notes (Signed)
See triage notes. Patient c/o small laceration to his head from a fall. Patient denies LOC.

## 2023-02-27 ENCOUNTER — Emergency Department: Payer: Medicare Other

## 2023-02-27 ENCOUNTER — Encounter: Payer: Self-pay | Admitting: Emergency Medicine

## 2023-02-27 ENCOUNTER — Other Ambulatory Visit: Payer: Self-pay

## 2023-02-27 ENCOUNTER — Emergency Department
Admission: EM | Admit: 2023-02-27 | Discharge: 2023-02-27 | Disposition: A | Discharge status: Home / Self Care | Payer: Medicare Other | Attending: Emergency Medicine | Admitting: Emergency Medicine

## 2023-02-27 DIAGNOSIS — S01111A Laceration without foreign body of right eyelid and periocular area, initial encounter: Secondary | ICD-10-CM | POA: Diagnosis not present

## 2023-02-27 DIAGNOSIS — W19XXXA Unspecified fall, initial encounter: Secondary | ICD-10-CM | POA: Diagnosis not present

## 2023-02-27 DIAGNOSIS — Z7901 Long term (current) use of anticoagulants: Secondary | ICD-10-CM | POA: Diagnosis not present

## 2023-02-27 DIAGNOSIS — S0990XA Unspecified injury of head, initial encounter: Secondary | ICD-10-CM | POA: Diagnosis present

## 2023-02-27 LAB — BASIC METABOLIC PANEL
Anion gap: 13 (ref 5–15)
BUN: 20 mg/dL (ref 8–23)
CO2: 25 mmol/L (ref 22–32)
Calcium: 9.8 mg/dL (ref 8.9–10.3)
Chloride: 97 mmol/L — ABNORMAL LOW (ref 98–111)
Creatinine, Ser: 1.81 mg/dL — ABNORMAL HIGH (ref 0.61–1.24)
GFR, Estimated: 36 mL/min — ABNORMAL LOW (ref >60)
Glucose, Bld: 110 mg/dL — ABNORMAL HIGH (ref 70–99)
Potassium: 3.3 mmol/L — ABNORMAL LOW (ref 3.5–5.1)
Sodium: 135 mmol/L (ref 135–145)

## 2023-02-27 LAB — CBC WITH DIFFERENTIAL/PLATELET
Abs Immature Granulocytes: 0.02 10*3/uL (ref 0.00–0.07)
Basophils Absolute: 0.1 10*3/uL (ref 0.0–0.1)
Basophils Relative: 1 %
Eosinophils Absolute: 0.1 10*3/uL (ref 0.0–0.5)
Eosinophils Relative: 1 %
HCT: 45 % (ref 39.0–52.0)
Hemoglobin: 14.9 g/dL (ref 13.0–17.0)
Immature Granulocytes: 0 %
Lymphocytes Relative: 17 %
Lymphs Abs: 1.6 10*3/uL (ref 0.7–4.0)
MCH: 32.7 pg (ref 26.0–34.0)
MCHC: 33.1 g/dL (ref 30.0–36.0)
MCV: 98.7 fL (ref 80.0–100.0)
Monocytes Absolute: 1.1 10*3/uL — ABNORMAL HIGH (ref 0.1–1.0)
Monocytes Relative: 11 %
Neutro Abs: 7 10*3/uL (ref 1.7–7.7)
Neutrophils Relative %: 70 %
Platelets: 236 10*3/uL (ref 150–400)
RBC: 4.56 MIL/uL (ref 4.22–5.81)
RDW: 14.6 % (ref 11.5–15.5)
WBC: 9.9 10*3/uL (ref 4.0–10.5)
nRBC: 0 % (ref 0.0–0.2)

## 2023-02-27 NOTE — Discharge Instructions (Signed)
Please follow-up with your primary care provider 1 week from now to recheck your kidney function.  Make sure you are hydrating at home appropriately.

## 2023-02-27 NOTE — ED Triage Notes (Signed)
Patient to ED via POV for a fall. States this is his 2nd fall in 2 days. States he fell on for head- lac to front of head covered by band aids. Takes blood thinners. No LOC.   Blue top sent to lab if needed.

## 2023-02-27 NOTE — ED Notes (Signed)
The patient's son was called and given an update.

## 2023-02-27 NOTE — ED Provider Notes (Signed)
[facility] Provider Note    Event Date/Time   First MD Initiated Contact with Patient 02/27/23 1755     (approximate)   History   Fall   HPI MOUSA HAUBER is a 88 y.o. male presenting today for fall.  Patient states 2 days ago he had a fall Hines was seen in the ED with reassuring workup.  Overnight he had a fall after losing his balance.  Hit the front of his face Hines had a small cut above his eyebrow.  Denies loss of consciousness.  No lightheadedness.  Is on Eliquis.  Denies trauma or injury elsewhere.  Has ambulated since then without difficulty.  Reportedly eating Hines drinking well with no other acute symptoms.     Physical Exam   Triage Vital Signs: ED Triage Vitals  Encounter Vitals Group     BP 02/27/23 1543 (!) 133/99     Systolic BP Percentile --      Diastolic BP Percentile --      Pulse Rate 02/27/23 1543 (!) 101     Resp 02/27/23 1543 17     Temp 02/27/23 1543 97.7 F (36.5 C)     Temp Source 02/27/23 1543 Oral     SpO2 02/27/23 1543 100 %     Weight 02/27/23 1544 185 lb (83.9 kg)     Height 02/27/23 1544 5\' 10"  (1.778 m)     Head Circumference --      Peak Flow --      Pain Score 02/27/23 1558 0     Pain Loc --      Pain Education --      Exclude from Growth Chart --     Most recent vital signs: Vitals:   02/27/23 1543  BP: (!) 133/99  Pulse: (!) 101  Resp: 17  Temp: 97.7 F (36.5 C)  SpO2: 100%   I have reviewed the vital signs. General:  Awake, alert, no acute distress. Head:  Normocephalic, mild abrasion to posterior occiput, 3 cm laceration above right eyebrow. EENT:  PERRL, EOMI, Oral mucosa pink Hines moist, Neck is supple. Cardiovascular: Regular rate, 2+ distal pulses. Respiratory:  Normal respiratory effort, symmetrical expansion, no distress.   Extremities:  Moving all four extremities through full ROM without pain.   Neuro:  Alert Hines oriented.  Interacting appropriately.   Skin:  Warm, dry, no rash.    Psych: Appropriate affect.    ED Results / Procedures / Treatments   Labs (all labs ordered are listed, but only abnormal results are displayed) Labs Reviewed  CBC WITH DIFFERENTIAL/PLATELET - Abnormal; Notable for the following components:      Result Value   Monocytes Absolute 1.1 (*)    All other components within normal limits  BASIC METABOLIC PANEL - Abnormal; Notable for the following components:   Potassium 3.3 (*)    Chloride 97 (*)    Glucose, Bld 110 (*)    Creatinine, Ser 1.81 (*)    GFR, Estimated 36 (*)    All other components within normal limits     EKG    RADIOLOGY CT head Hines C-spine independently interpreted by me with no acute pathology   PROCEDURES:  Critical Care performed: No  .Laceration Repair  Date/Time: 02/27/2023 6:41 PM  Performed by: Janith Lima, MD Authorized by: Janith Lima, MD   Consent:    Consent obtained:  Verbal   Consent given by:  Patient   Risks, benefits, Hines  alternatives were discussed: yes     Risks discussed:  Infection, pain, poor wound healing Hines poor cosmetic result   Alternatives discussed:  No treatment Universal protocol:    Patient identity confirmed:  Verbally with patient Anesthesia:    Anesthesia method:  None Laceration details:    Location:  Face   Face location:  R eyebrow   Length (cm):  3   Depth (mm):  1 Pre-procedure details:    Preparation:  Patient was prepped Hines draped in usual sterile fashion Hines imaging obtained to evaluate for foreign bodies Exploration:    Hemostasis achieved with:  Direct pressure   Wound exploration: wound explored through full range of motion   Treatment:    Area cleansed with:  Chlorhexidine   Amount of cleaning:  Standard   Irrigation solution:  Sterile saline   Irrigation method:  Tap Skin repair:    Repair method:  Tissue adhesive Approximation:    Approximation:  Close Repair type:    Repair type:  Simple Post-procedure details:    Dressing:   Open (no dressing)   Procedure completion:  Tolerated well, no immediate complications    MEDICATIONS ORDERED IN ED: Medications - No data to display   IMPRESSION / MDM / ASSESSMENT Hines PLAN / ED COURSE  I reviewed the triage vital signs Hines the nursing notes.                              Differential diagnosis includes, but is not limited to, ICH, cervical spine injury, soft tissue hematoma, facial laceration  Patient's presentation is most consistent with acute complicated illness / injury requiring diagnostic workup.  Patient is an 88 year old male presenting today for ground-level fall with injury to the face.  Small laceration above the right eyebrow.  This was cleaned Hines debrided Hines treated with Dermabond as it was well-approximated.  CT imaging of head Hines C-spine shows no acute traumatic pathology.  Laboratory workup shows slight AKI from his baseline.  Patient preferred oral hydration over IV fluids.  Otherwise safe for discharge.  Told to follow-up with PCP for recheck of his creatinine within the week.  Tetanus shot was updated in June 2024 Hines does not need to be updated today.  Patient agreeable with plan Hines given strict return precautions.  Clinical Course as of 02/27/23 1908  Tue Feb 27, 2023  1813 Most recent creatinine baseline at 1.5 [DW]    Clinical Course User Index [DW] Janith Lima, MD     FINAL CLINICAL IMPRESSION(S) / ED DIAGNOSES   Final diagnoses:  Fall, initial encounter  Laceration of right eyebrow, initial encounter     Rx / DC Orders   ED Discharge Orders     None        Note:  This document was prepared using Dragon voice recognition software Hines may include unintentional dictation errors.   Janith Lima, MD 02/27/23 Windell Moment

## 2023-02-27 NOTE — ED Notes (Signed)
See triage notes. Patient c/o fall resulting in a laceration to the right eyebrow.

## 2023-04-27 ENCOUNTER — Other Ambulatory Visit: Payer: Self-pay | Admitting: Student

## 2023-04-27 ENCOUNTER — Ambulatory Visit
Admission: RE | Admit: 2023-04-27 | Discharge: 2023-04-27 | Disposition: A | Discharge status: Home / Self Care | Source: Ambulatory Visit | Attending: Student | Admitting: Student

## 2023-04-27 DIAGNOSIS — R0781 Pleurodynia: Secondary | ICD-10-CM

## 2023-05-21 ENCOUNTER — Other Ambulatory Visit: Payer: Self-pay | Admitting: Student

## 2023-05-21 DIAGNOSIS — I70213 Atherosclerosis of native arteries of extremities with intermittent claudication, bilateral legs: Secondary | ICD-10-CM

## 2023-05-30 ENCOUNTER — Ambulatory Visit
Admission: RE | Admit: 2023-05-30 | Discharge: 2023-05-30 | Disposition: A | Discharge status: Home / Self Care | Source: Ambulatory Visit | Attending: Student | Admitting: Student

## 2023-05-30 DIAGNOSIS — I70213 Atherosclerosis of native arteries of extremities with intermittent claudication, bilateral legs: Secondary | ICD-10-CM

## 2023-07-24 ENCOUNTER — Emergency Department
Admission: EM | Admit: 2023-07-24 | Discharge: 2023-07-24 | Disposition: A | Discharge status: Home / Self Care | Attending: Emergency Medicine | Admitting: Emergency Medicine

## 2023-07-24 ENCOUNTER — Emergency Department

## 2023-07-24 DIAGNOSIS — S8012XA Contusion of left lower leg, initial encounter: Secondary | ICD-10-CM | POA: Diagnosis not present

## 2023-07-24 DIAGNOSIS — W228XXA Striking against or struck by other objects, initial encounter: Secondary | ICD-10-CM | POA: Insufficient documentation

## 2023-07-24 DIAGNOSIS — S8992XA Unspecified injury of left lower leg, initial encounter: Secondary | ICD-10-CM | POA: Diagnosis present

## 2023-07-24 DIAGNOSIS — I878 Other specified disorders of veins: Secondary | ICD-10-CM | POA: Insufficient documentation

## 2023-07-24 LAB — BASIC METABOLIC PANEL WITH GFR
Anion gap: 9 (ref 5–15)
BUN: 17 mg/dL (ref 8–23)
CO2: 26 mmol/L (ref 22–32)
Calcium: 9.7 mg/dL (ref 8.9–10.3)
Chloride: 104 mmol/L (ref 98–111)
Creatinine, Ser: 0.91 mg/dL (ref 0.61–1.24)
GFR, Estimated: >60 mL/min (ref >60)
Glucose, Bld: 110 mg/dL — ABNORMAL HIGH (ref 70–99)
Potassium: 4.4 mmol/L (ref 3.5–5.1)
Sodium: 139 mmol/L (ref 135–145)

## 2023-07-24 LAB — CBC
HCT: 38.7 % — ABNORMAL LOW (ref 39.0–52.0)
Hemoglobin: 12.4 g/dL — ABNORMAL LOW (ref 13.0–17.0)
MCH: 30.9 pg (ref 26.0–34.0)
MCHC: 32 g/dL (ref 30.0–36.0)
MCV: 96.5 fL (ref 80.0–100.0)
Platelets: 229 10*3/uL (ref 150–400)
RBC: 4.01 MIL/uL — ABNORMAL LOW (ref 4.22–5.81)
RDW: 14.5 % (ref 11.5–15.5)
WBC: 4.9 10*3/uL (ref 4.0–10.5)
nRBC: 0 % (ref 0.0–0.2)

## 2023-07-24 NOTE — ED Triage Notes (Signed)
 Pt to ED via POV from [facility]. Pt reports swelling to RL leg x1 wk after hitting it on car door. Pt is on blood thinner. No SOB or CP.

## 2023-07-24 NOTE — ED Provider Notes (Signed)
 [facility] Provider Note    Event Date/Time   First MD Initiated Contact with Patient 07/24/23 1647     (approximate)   History   Leg Swelling (/)   HPI  Jimmy Hines is a 88 y.o. male patient reports some swelling in his left calf for about 1 week.  No changes in the skin, since having hip surgeries always had a little bit of discoloration around the left calf area.  He contused it on a car door.  A reports that using evaluated and sent to make certain he did have a blood clot  It is not painful, he said no fevers no redness was not drained in the thing.     Physical Exam   Triage Vital Signs: ED Triage Vitals  Encounter Vitals Group     BP 07/24/23 1615 (!) 158/93     Girls Systolic BP Percentile --      Girls Diastolic BP Percentile --      Boys Systolic BP Percentile --      Boys Diastolic BP Percentile --      Pulse Rate 07/24/23 1613 (!) 58     Resp 07/24/23 1613 20     Temp 07/24/23 1613 97.8 F (36.6 C)     Temp Source 07/24/23 1613 Oral     SpO2 07/24/23 1613 97 %     Weight 07/24/23 1613 186 lb (84.4 kg)     Height 07/24/23 1613 5' 10 (1.778 m)     Head Circumference --      Peak Flow --      Pain Score 07/24/23 1613 4     Pain Loc --      Pain Education --      Exclude from Growth Chart --     Most recent vital signs: Vitals:   07/24/23 1613 07/24/23 1615  BP:  (!) 158/93  Pulse: (!) 58   Resp: 20   Temp: 97.8 F (36.6 C)   SpO2: 97%      General: Awake, no distress.  CV:  Good peripheral perfusion.  Strong palpable dorsalis pedis and posterior tibial pulses in the left foot warm and well-perfused. Resp:  Normal effort.  Abd:  No distention.  Other:  Very mild edema in the left area of the calf.  Good range of motion without pain or deficit.  Patient does have peripheral neuropathy.  Venous stasis changes of venous dermatitis-like finding over the left anterior calf patient reports that to be chronic.  There  is a small abrasion but no surrounding purulence no warmth no erythema.  No obvious evidence of infection.  Compartments are soft   ED Results / Procedures / Treatments   Labs (all labs ordered are listed, but only abnormal results are displayed) Labs Reviewed  CBC - Abnormal; Notable for the following components:      Result Value   RBC 4.01 (*)    Hemoglobin 12.4 (*)    HCT 38.7 (*)    All other components within normal limits  BASIC METABOLIC PANEL WITH GFR - Abnormal; Notable for the following components:   Glucose, Bld 110 (*)    All other components within normal limits   CBC remarkable for normal white count no evidence of infection  EKG     RADIOLOGY     PROCEDURES:  Critical Care performed: No  Procedures   MEDICATIONS ORDERED IN ED: Medications - No data to display   IMPRESSION /  MDM / ASSESSMENT AND PLAN / ED COURSE  I reviewed the triage vital signs and the nursing notes.                              Differential diagnosis includes, but is not limited to, contusion, hematoma, venous stasis, DVT etc.  He is anticoagulated making DVT highly unlikely to begin with, and his ultrasound shows no evidence of DVT of the left lower extremity.  No other extremity affected.  He is awake alert well-oriented normal hemodynamics no signs or symptoms suggest infectious or superinfection.  Suspect contusion.  Patient's presentation is most consistent with acute complicated illness / injury requiring diagnostic workup.   Return precautions and treatment recommendations and follow-up discussed with the patient who is agreeable with the plan.  Counseled patient carefully to watch for any signs of infection that he should seek medical care if he noticed any drainage, fevers chills redness or warmth in that leg.  At this time no evidence of infection or skin breakdown.        FINAL CLINICAL IMPRESSION(S) / ED DIAGNOSES   Final diagnoses:  Contusion of left calf,  initial encounter  Venous stasis     Rx / DC Orders   ED Discharge Orders     None        Note:  This document was prepared using Dragon voice recognition software and may include unintentional dictation errors.   Iver Marker, MD 07/24/23 [phone]

## 2023-09-24 ENCOUNTER — Other Ambulatory Visit: Payer: Self-pay | Admitting: Student

## 2023-09-24 DIAGNOSIS — R519 Headache, unspecified: Secondary | ICD-10-CM

## 2023-09-24 DIAGNOSIS — W19XXXA Unspecified fall, initial encounter: Secondary | ICD-10-CM

## 2023-09-26 ENCOUNTER — Ambulatory Visit
Admission: RE | Admit: 2023-09-26 | Discharge: 2023-09-26 | Disposition: A | Discharge status: Home / Self Care | Source: Ambulatory Visit | Attending: Student | Admitting: Student

## 2023-09-26 DIAGNOSIS — R519 Headache, unspecified: Secondary | ICD-10-CM

## 2023-09-26 DIAGNOSIS — W19XXXA Unspecified fall, initial encounter: Secondary | ICD-10-CM

## 2023-12-06 ENCOUNTER — Emergency Department
Admission: EM | Admit: 2023-12-06 | Discharge: 2023-12-06 | Disposition: A | Discharge status: Home / Self Care | Attending: Emergency Medicine | Admitting: Emergency Medicine

## 2023-12-06 ENCOUNTER — Other Ambulatory Visit: Payer: Self-pay

## 2023-12-06 ENCOUNTER — Encounter: Payer: Self-pay | Admitting: Emergency Medicine

## 2023-12-06 ENCOUNTER — Emergency Department

## 2023-12-06 DIAGNOSIS — S81812A Laceration without foreign body, left lower leg, initial encounter: Secondary | ICD-10-CM | POA: Diagnosis not present

## 2023-12-06 DIAGNOSIS — R944 Abnormal results of kidney function studies: Secondary | ICD-10-CM | POA: Insufficient documentation

## 2023-12-06 DIAGNOSIS — Z7901 Long term (current) use of anticoagulants: Secondary | ICD-10-CM | POA: Diagnosis not present

## 2023-12-06 DIAGNOSIS — S8992XA Unspecified injury of left lower leg, initial encounter: Secondary | ICD-10-CM | POA: Diagnosis present

## 2023-12-06 DIAGNOSIS — W01198A Fall on same level from slipping, tripping and stumbling with subsequent striking against other object, initial encounter: Secondary | ICD-10-CM | POA: Diagnosis not present

## 2023-12-06 DIAGNOSIS — W19XXXA Unspecified fall, initial encounter: Secondary | ICD-10-CM

## 2023-12-06 DIAGNOSIS — T148XXA Other injury of unspecified body region, initial encounter: Secondary | ICD-10-CM

## 2023-12-06 DIAGNOSIS — S41112A Laceration without foreign body of left upper arm, initial encounter: Secondary | ICD-10-CM | POA: Insufficient documentation

## 2023-12-06 DIAGNOSIS — S0003XA Contusion of scalp, initial encounter: Secondary | ICD-10-CM | POA: Insufficient documentation

## 2023-12-06 DIAGNOSIS — S0990XA Unspecified injury of head, initial encounter: Secondary | ICD-10-CM

## 2023-12-06 DIAGNOSIS — T07XXXA Unspecified multiple injuries, initial encounter: Secondary | ICD-10-CM

## 2023-12-06 LAB — COMPREHENSIVE METABOLIC PANEL WITH GFR
ALT: 18 U/L (ref 0–44)
AST: 32 U/L (ref 15–41)
Albumin: 4.2 g/dL (ref 3.5–5.0)
Alkaline Phosphatase: 92 U/L (ref 38–126)
Anion gap: 11 (ref 5–15)
BUN: 28 mg/dL — ABNORMAL HIGH (ref 8–23)
CO2: 26 mmol/L (ref 22–32)
Calcium: 9.2 mg/dL (ref 8.9–10.3)
Chloride: 102 mmol/L (ref 98–111)
Creatinine, Ser: 1.81 mg/dL — ABNORMAL HIGH (ref 0.61–1.24)
GFR, Estimated: 36 mL/min — ABNORMAL LOW (ref >60)
Glucose, Bld: 112 mg/dL — ABNORMAL HIGH (ref 70–99)
Potassium: 3.7 mmol/L (ref 3.5–5.1)
Sodium: 139 mmol/L (ref 135–145)
Total Bilirubin: 0.8 mg/dL (ref 0.0–1.2)
Total Protein: 7.9 g/dL (ref 6.5–8.1)

## 2023-12-06 LAB — CBC WITH DIFFERENTIAL/PLATELET
Abs Immature Granulocytes: 0.03 K/uL (ref 0.00–0.07)
Basophils Absolute: 0.1 K/uL (ref 0.0–0.1)
Basophils Relative: 1 %
Eosinophils Absolute: 0.3 K/uL (ref 0.0–0.5)
Eosinophils Relative: 3 %
HCT: 39.5 % (ref 39.0–52.0)
Hemoglobin: 12.8 g/dL — ABNORMAL LOW (ref 13.0–17.0)
Immature Granulocytes: 0 %
Lymphocytes Relative: 12 %
Lymphs Abs: 1.1 K/uL (ref 0.7–4.0)
MCH: 30.5 pg (ref 26.0–34.0)
MCHC: 32.4 g/dL (ref 30.0–36.0)
MCV: 94.3 fL (ref 80.0–100.0)
Monocytes Absolute: 1.1 K/uL — ABNORMAL HIGH (ref 0.1–1.0)
Monocytes Relative: 12 %
Neutro Abs: 6.8 K/uL (ref 1.7–7.7)
Neutrophils Relative %: 72 %
Platelets: 217 K/uL (ref 150–400)
RBC: 4.19 MIL/uL — ABNORMAL LOW (ref 4.22–5.81)
RDW: 15.5 % (ref 11.5–15.5)
WBC: 9.4 K/uL (ref 4.0–10.5)
nRBC: 0 % (ref 0.0–0.2)

## 2023-12-06 LAB — PROTIME-INR
INR: 1.3 — ABNORMAL HIGH (ref 0.8–1.2)
Prothrombin Time: 17.2 s — ABNORMAL HIGH (ref 11.4–15.2)

## 2023-12-06 LAB — LACTIC ACID, PLASMA: Lactic Acid, Venous: 1.5 mmol/L (ref 0.5–1.9)

## 2023-12-06 MED ORDER — SODIUM CHLORIDE 0.9 % IV BOLUS
500.0000 mL | Freq: Once | INTRAVENOUS | Status: AC
  Administered 2023-12-06: 500 mL via INTRAVENOUS

## 2023-12-06 MED ORDER — BACITRACIN ZINC 500 UNIT/GM EX OINT
TOPICAL_OINTMENT | Freq: Two times a day (BID) | CUTANEOUS | Status: DC
  Administered 2023-12-06: 1 via TOPICAL
  Filled 2023-12-06: qty 3.6

## 2023-12-06 NOTE — ED Notes (Signed)
 Wound care performed by this RN and Bobetta PEAK. Used bacitracin , telfa, and kerlix. Patient tolerated wound care well.

## 2023-12-06 NOTE — ED Provider Notes (Signed)
 [facility] Provider Note    Event Date/Time   First MD Initiated Contact with Patient 12/06/23 0028     (approximate)   History   Fall   HPI Jimmy CUTSFORTH is a 88 y.o. male who presents for evaluation after a fall.  He laughs and says that he is a bit unsteady on his feet.  He is very active and independent but has fallen a few times recently, leading to multiple abrasions.  He said that he just got tripped up this time and fell and struck the back of his head as well as his left shin and a few places on his left arm.  He did not lose consciousness and feels fine and back to normal.  He said nothing is bothering him.  However he takes Eliquis chronically.  His adult son is with him at bedside and confirmed the history and said he has no specific concerns himself about the patient, his current status or being off of his baseline, etc.  The patient denies headache, neck pain, chest pain, shortness of breath, nausea, vomiting, and abdominal pain.     Physical Exam   Triage Vital Signs: ED Triage Vitals  Encounter Vitals Group     BP 12/06/23 0021 131/86     Girls Systolic BP Percentile --      Girls Diastolic BP Percentile --      Boys Systolic BP Percentile --      Boys Diastolic BP Percentile --      Pulse Rate 12/06/23 0021 (!) 109     Resp 12/06/23 0021 18     Temp 12/06/23 0021 98.1 F (36.7 C)     Temp Source 12/06/23 0021 Oral     SpO2 12/06/23 0021 100 %     Weight --      Height --      Head Circumference --      Peak Flow --      Pain Score 12/06/23 0022 5     Pain Loc --      Pain Education --      Exclude from Growth Chart --     Most recent vital signs: Vitals:   12/06/23 0021  BP: 131/86  Pulse: (!) 109  Resp: 18  Temp: 98.1 F (36.7 C)  SpO2: 100%    General: Awake, alert, conversant, pleasant. CV:  Good peripheral perfusion.  Mild tachycardia initially, improved after fluids.  Regular rhythm. Resp:  Normal effort.  Speaking easily and comfortably, no accessory muscle usage nor intercostal retractions.   Abd:  No distention.  Other:  Patient has multiple relatively superficial skin avulsions and abrasions primarily on his left arm but he has a larger deeper 1 on his left shin.  However there is missing skin (superficially) and there is no way to suture or cover it with Steri-Strips and it (and the other wounds) will need to heal by secondary intention.  No contamination of the wounds is evident.  Patient also has a hematoma on the left back part of his scalp with an abrasion but no active bleeding and no evidence of laceration.  No tenderness to palpation of the cervical spine and no pain or tenderness with flexion, extension, and rotation of the head and neck from side-to-side.   ED Results / Procedures / Treatments   Labs (all labs ordered are listed, but only abnormal results are displayed) Labs Reviewed  COMPREHENSIVE METABOLIC PANEL WITH GFR - Abnormal;  Notable for the following components:      Result Value   Glucose, Bld 112 (*)    BUN 28 (*)    Creatinine, Ser 1.81 (*)    GFR, Estimated 36 (*)    All other components within normal limits  CBC WITH DIFFERENTIAL/PLATELET - Abnormal; Notable for the following components:   RBC 4.19 (*)    Hemoglobin 12.8 (*)    Monocytes Absolute 1.1 (*)    All other components within normal limits  PROTIME-INR - Abnormal; Notable for the following components:   Prothrombin Time 17.2 (*)    INR 1.3 (*)    All other components within normal limits  LACTIC ACID, PLASMA     RADIOLOGY I independently viewed and interpreted the patient's head CT and cervical spine CT and I see no evidence of intracranial hemorrhage nor of neck fracture.  I also read the radiologist's report, which confirmed no acute findings.   PROCEDURES:  Critical Care performed: No  Procedures    IMPRESSION / MDM / ASSESSMENT AND PLAN / ED COURSE  I reviewed the triage vital signs  and the nursing notes.                              Differential diagnosis includes, but is not limited to, intracranial hemorrhage, fracture, dislocation, metabolic or electrolyte abnormality, renal failure.  Patient's presentation is most consistent with acute presentation with potential threat to life or bodily function.  Labs/studies ordered: Lactic acid, pro time-INR, CBC with differential, CMP  Interventions/Medications given:  Medications  bacitracin  ointment (has no administration in time range)  sodium chloride  0.9 % bolus 500 mL (500 mLs Intravenous New Bag/Given 12/06/23 0055)    (Note:  hospital course my include additional interventions and/or labs/studies not listed above.)   Patient appears younger than chronological age but is clearly not as steady on his feet as he once was.  Initially was slightly tachycardic which made me initiate the possible sepsis workup, but he is very well-appearing with no evidence of infection.  He declines to provide a urine specimen.  No leukocytosis, no fever, heart rate normalized after a 500 mL liter bolus.  His creatinine is slightly elevated 1.8 which is higher than it was last time but he has been at 1.8 and 1.3 in the past.  I encouraged oral intake and close outpatient follow-up.  The patient is pleased to leave and is comfortable with the plan and wants no further evaluation or treatment.  His son agrees.  The patient's medical screening exam is reassuring with no indication of an emergent medical condition requiring hospitalization or additional evaluation at this point.  The patient is safe and appropriate for discharge and outpatient follow up.         FINAL CLINICAL IMPRESSION(S) / ED DIAGNOSES   Final diagnoses:  Fall, initial encounter  Minor head injury, initial encounter  Scalp hematoma, initial encounter  Multiple abrasions  Skin avulsion     Rx / DC Orders   ED Discharge Orders     None        Note:   This document was prepared using Dragon voice recognition software and may include unintentional dictation errors.   Gordan Huxley, MD 12/06/23 [id number]

## 2023-12-06 NOTE — Discharge Instructions (Addendum)
 You have been seen in the Emergency Department (ED) today for a fall.  Your work up does not show any concerning injuries.  Please take over-the-counter ibuprofen and/or Tylenol  as needed for your pain (unless you have an allergy or your doctor as told you not to take them), or take any prescribed medication as instructed.    Your wounds will need to heal from the inside out.  We recommend that you keep them clean by washing them very gently twice daily and then applying a thin layer of antibiotic ointment such as bacitracin  and then covering them with clean gauze or a nonstick bandage.  Please follow up with your doctor regarding today's Emergency Department (ED) visit and your recent fall.    Return to the ED if you have any headache, confusion, slurred speech, weakness/numbness of any arm or leg, or any increased pain.

## 2023-12-06 NOTE — ED Triage Notes (Signed)
 Pt arrives POV via WC who reports falling tonight on front porch. Pt states he lost his balance and fell down three conrete steps. Hit head but denies LOC; pt takes eliquis. Pt has pressure dressing on left lower leg. Hematoma noted back of head. Skin tears reported to left arm. Son reports pt fell two days ago and twice today. Pt only c/o of pain in left lower leg. Denies n/v or dizziness. NADN.

## 2023-12-06 NOTE — ED Notes (Signed)
 Patient transported to CT

## 2024-01-10 ENCOUNTER — Encounter (HOSPITAL_BASED_OUTPATIENT_CLINIC_OR_DEPARTMENT_OTHER): Attending: Internal Medicine | Admitting: Internal Medicine

## 2024-01-10 DIAGNOSIS — L97822 Non-pressure chronic ulcer of other part of left lower leg with fat layer exposed: Secondary | ICD-10-CM

## 2024-01-10 DIAGNOSIS — I87312 Chronic venous hypertension (idiopathic) with ulcer of left lower extremity: Secondary | ICD-10-CM

## 2024-01-10 DIAGNOSIS — T798XXA Other early complications of trauma, initial encounter: Secondary | ICD-10-CM | POA: Diagnosis not present

## 2024-01-17 ENCOUNTER — Encounter (HOSPITAL_BASED_OUTPATIENT_CLINIC_OR_DEPARTMENT_OTHER): Admitting: Internal Medicine
